# Patient Record
Sex: Male | Born: 1935 | Race: White | Hispanic: No | Marital: Married | Smoking: Former smoker
Health system: Southern US, Community
[De-identification: ages and names within clinical notes are randomized; demographics above are authoritative.]

## PROBLEM LIST (undated history)

## (undated) DIAGNOSIS — I2510 Atherosclerotic heart disease of native coronary artery without angina pectoris: Secondary | ICD-10-CM

## (undated) DIAGNOSIS — D649 Anemia, unspecified: Secondary | ICD-10-CM

## (undated) DIAGNOSIS — M1990 Unspecified osteoarthritis, unspecified site: Secondary | ICD-10-CM

## (undated) DIAGNOSIS — E039 Hypothyroidism, unspecified: Secondary | ICD-10-CM

## (undated) DIAGNOSIS — G2581 Restless legs syndrome: Secondary | ICD-10-CM

## (undated) DIAGNOSIS — I10 Essential (primary) hypertension: Secondary | ICD-10-CM

## (undated) DIAGNOSIS — E7800 Pure hypercholesterolemia, unspecified: Secondary | ICD-10-CM

---

## 2013-10-24 ENCOUNTER — Inpatient Hospital Stay (HOSPITAL_COMMUNITY)
Admission: AD | Admit: 2013-10-24 | Discharge: 2013-10-30 | DRG: 871 | Disposition: A | Discharge status: Skilled Nursing Facility | Payer: Medicare Other | Source: Other Acute Inpatient Hospital | Attending: Pulmonary Disease | Admitting: Pulmonary Disease

## 2013-10-24 ENCOUNTER — Inpatient Hospital Stay (HOSPITAL_COMMUNITY): Payer: Medicare Other

## 2013-10-24 DIAGNOSIS — I10 Essential (primary) hypertension: Secondary | ICD-10-CM | POA: Diagnosis present

## 2013-10-24 DIAGNOSIS — M129 Arthropathy, unspecified: Secondary | ICD-10-CM | POA: Diagnosis present

## 2013-10-24 DIAGNOSIS — E785 Hyperlipidemia, unspecified: Secondary | ICD-10-CM | POA: Diagnosis present

## 2013-10-24 DIAGNOSIS — E46 Unspecified protein-calorie malnutrition: Secondary | ICD-10-CM | POA: Diagnosis present

## 2013-10-24 DIAGNOSIS — Z87891 Personal history of nicotine dependence: Secondary | ICD-10-CM | POA: Diagnosis not present

## 2013-10-24 DIAGNOSIS — IMO0002 Reserved for concepts with insufficient information to code with codable children: Secondary | ICD-10-CM

## 2013-10-24 DIAGNOSIS — I2510 Atherosclerotic heart disease of native coronary artery without angina pectoris: Secondary | ICD-10-CM | POA: Diagnosis present

## 2013-10-24 DIAGNOSIS — K529 Noninfective gastroenteritis and colitis, unspecified: Secondary | ICD-10-CM

## 2013-10-24 DIAGNOSIS — G2581 Restless legs syndrome: Secondary | ICD-10-CM | POA: Diagnosis present

## 2013-10-24 DIAGNOSIS — J9819 Other pulmonary collapse: Secondary | ICD-10-CM | POA: Diagnosis not present

## 2013-10-24 DIAGNOSIS — N179 Acute kidney failure, unspecified: Secondary | ICD-10-CM | POA: Diagnosis not present

## 2013-10-24 DIAGNOSIS — J9600 Acute respiratory failure, unspecified whether with hypoxia or hypercapnia: Secondary | ICD-10-CM | POA: Diagnosis not present

## 2013-10-24 DIAGNOSIS — A419 Sepsis, unspecified organism: Secondary | ICD-10-CM | POA: Diagnosis present

## 2013-10-24 DIAGNOSIS — R6521 Severe sepsis with septic shock: Secondary | ICD-10-CM

## 2013-10-24 DIAGNOSIS — E876 Hypokalemia: Secondary | ICD-10-CM | POA: Diagnosis not present

## 2013-10-24 DIAGNOSIS — E039 Hypothyroidism, unspecified: Secondary | ICD-10-CM | POA: Diagnosis present

## 2013-10-24 DIAGNOSIS — E2749 Other adrenocortical insufficiency: Secondary | ICD-10-CM | POA: Diagnosis present

## 2013-10-24 DIAGNOSIS — Z79899 Other long term (current) drug therapy: Secondary | ICD-10-CM

## 2013-10-24 DIAGNOSIS — K5730 Diverticulosis of large intestine without perforation or abscess without bleeding: Secondary | ICD-10-CM | POA: Diagnosis present

## 2013-10-24 DIAGNOSIS — R578 Other shock: Secondary | ICD-10-CM | POA: Diagnosis present

## 2013-10-24 DIAGNOSIS — I509 Heart failure, unspecified: Secondary | ICD-10-CM | POA: Diagnosis present

## 2013-10-24 DIAGNOSIS — K5289 Other specified noninfective gastroenteritis and colitis: Secondary | ICD-10-CM | POA: Diagnosis present

## 2013-10-24 DIAGNOSIS — D649 Anemia, unspecified: Secondary | ICD-10-CM | POA: Diagnosis present

## 2013-10-24 DIAGNOSIS — R112 Nausea with vomiting, unspecified: Secondary | ICD-10-CM | POA: Diagnosis present

## 2013-10-24 DIAGNOSIS — R5381 Other malaise: Secondary | ICD-10-CM | POA: Diagnosis not present

## 2013-10-24 DIAGNOSIS — R6520 Severe sepsis without septic shock: Secondary | ICD-10-CM

## 2013-10-24 DIAGNOSIS — I5032 Chronic diastolic (congestive) heart failure: Secondary | ICD-10-CM | POA: Diagnosis present

## 2013-10-24 HISTORY — DX: Restless legs syndrome: G25.81

## 2013-10-24 HISTORY — DX: Hypothyroidism, unspecified: E03.9

## 2013-10-24 HISTORY — DX: Anemia, unspecified: D64.9

## 2013-10-24 HISTORY — DX: Pure hypercholesterolemia, unspecified: E78.00

## 2013-10-24 HISTORY — DX: Unspecified osteoarthritis, unspecified site: M19.90

## 2013-10-24 HISTORY — DX: Atherosclerotic heart disease of native coronary artery without angina pectoris: I25.10

## 2013-10-24 HISTORY — DX: Essential (primary) hypertension: I10

## 2013-10-24 LAB — COMPREHENSIVE METABOLIC PANEL
ALK PHOS: 49 U/L (ref 39–117)
ALT: 13 U/L (ref 0–53)
AST: 33 U/L (ref 0–37)
Albumin: 2.7 g/dL — ABNORMAL LOW (ref 3.5–5.2)
BILIRUBIN TOTAL: 0.5 mg/dL (ref 0.3–1.2)
BUN: 24 mg/dL — AB (ref 6–23)
CO2: 21 meq/L (ref 19–32)
CREATININE: 1.01 mg/dL (ref 0.50–1.35)
Calcium: 7.3 mg/dL — ABNORMAL LOW (ref 8.4–10.5)
Chloride: 104 mEq/L (ref 96–112)
GFR, EST AFRICAN AMERICAN: 78 mL/min — AB (ref >90)
GFR, EST NON AFRICAN AMERICAN: 67 mL/min — AB (ref >90)
Glucose, Bld: 125 mg/dL — ABNORMAL HIGH (ref 70–99)
POTASSIUM: 4 meq/L (ref 3.7–5.3)
Sodium: 137 mEq/L (ref 137–147)
Total Protein: 5.4 g/dL — ABNORMAL LOW (ref 6.0–8.3)

## 2013-10-24 LAB — CBC WITH DIFFERENTIAL/PLATELET
Basophils Absolute: 0 10*3/uL (ref 0.0–0.1)
Basophils Relative: 0 % (ref 0–1)
Eosinophils Absolute: 0 10*3/uL (ref 0.0–0.7)
Eosinophils Relative: 0 % (ref 0–5)
HEMATOCRIT: 30.5 % — AB (ref 39.0–52.0)
HEMOGLOBIN: 10.3 g/dL — AB (ref 13.0–17.0)
LYMPHS ABS: 0.6 10*3/uL — AB (ref 0.7–4.0)
Lymphocytes Relative: 10 % — ABNORMAL LOW (ref 12–46)
MCH: 32 pg (ref 26.0–34.0)
MCHC: 33.8 g/dL (ref 30.0–36.0)
MCV: 94.7 fL (ref 78.0–100.0)
MONO ABS: 0.3 10*3/uL (ref 0.1–1.0)
Monocytes Relative: 6 % (ref 3–12)
NEUTROS PCT: 84 % — AB (ref 43–77)
Neutro Abs: 4.6 10*3/uL (ref 1.7–7.7)
Platelets: 146 10*3/uL — ABNORMAL LOW (ref 150–400)
RBC: 3.22 MIL/uL — AB (ref 4.22–5.81)
RDW: 13.9 % (ref 11.5–15.5)
WBC: 5.5 10*3/uL (ref 4.0–10.5)

## 2013-10-24 LAB — LACTIC ACID, PLASMA: Lactic Acid, Venous: 1.6 mmol/L (ref 0.5–2.2)

## 2013-10-24 LAB — PROTIME-INR
INR: 1.15 (ref 0.00–1.49)
Prothrombin Time: 14.5 seconds (ref 11.6–15.2)

## 2013-10-24 LAB — PROCALCITONIN: PROCALCITONIN: 0.98 ng/mL

## 2013-10-24 LAB — PHOSPHORUS: PHOSPHORUS: 2.1 mg/dL — AB (ref 2.3–4.6)

## 2013-10-24 LAB — LIPASE, BLOOD: Lipase: 17 U/L (ref 11–59)

## 2013-10-24 LAB — MAGNESIUM: Magnesium: 1.4 mg/dL — ABNORMAL LOW (ref 1.5–2.5)

## 2013-10-24 LAB — MRSA PCR SCREENING: MRSA BY PCR: NEGATIVE

## 2013-10-24 MED ORDER — SODIUM CHLORIDE 0.9 % IV SOLN: 750.0000 mL | INTRAVENOUS | Status: DC | PRN

## 2013-10-24 MED ORDER — SODIUM CHLORIDE 0.9 % IV SOLN
INTRAVENOUS | Status: DC
  Administered 2013-10-24 – 2013-10-25 (×2): via INTRAVENOUS

## 2013-10-24 MED ORDER — SODIUM CHLORIDE 0.9 % IV BOLUS (SEPSIS)
1000.0000 mL | Freq: Once | INTRAVENOUS | Status: AC
  Administered 2013-10-24: 1000 mL via INTRAVENOUS

## 2013-10-24 MED ORDER — SODIUM PHOSPHATE 3 MMOLE/ML IV SOLN
30.0000 mmol | Freq: Once | INTRAVENOUS | Status: AC
  Administered 2013-10-25: 30 mmol via INTRAVENOUS
  Filled 2013-10-24: qty 10

## 2013-10-24 MED ORDER — ONDANSETRON HCL 4 MG/2ML IJ SOLN: 4.0000 mg | Freq: Four times a day (QID) | INTRAMUSCULAR | Status: DC | PRN

## 2013-10-24 MED ORDER — MAGNESIUM SULFATE 4000MG/100ML IJ SOLN
4.0000 g | Freq: Once | INTRAMUSCULAR | Status: AC
  Administered 2013-10-25: 4 g via INTRAVENOUS
  Filled 2013-10-24: qty 100

## 2013-10-24 MED ORDER — METRONIDAZOLE IN NACL 5-0.79 MG/ML-% IV SOLN
500.0000 mg | Freq: Three times a day (TID) | INTRAVENOUS | Status: DC
  Administered 2013-10-24 – 2013-10-26 (×5): 500 mg via INTRAVENOUS
  Filled 2013-10-24 (×7): qty 100

## 2013-10-24 MED ORDER — SODIUM CHLORIDE 0.9 % IV SOLN
2.0000 ug/min | INTRAVENOUS | Status: DC
  Filled 2013-10-24: qty 4

## 2013-10-24 MED ORDER — LEVOTHYROXINE SODIUM 25 MCG PO TABS
25.0000 ug | ORAL_TABLET | Freq: Every day | ORAL | Status: DC
  Administered 2013-10-25 – 2013-10-30 (×6): 25 ug via ORAL
  Filled 2013-10-24 (×7): qty 1

## 2013-10-24 MED ORDER — HYDROCORTISONE NA SUCCINATE PF 100 MG IJ SOLR
50.0000 mg | Freq: Four times a day (QID) | INTRAMUSCULAR | Status: DC
  Administered 2013-10-24 – 2013-10-25 (×5): 50 mg via INTRAVENOUS
  Filled 2013-10-24 (×6): qty 1

## 2013-10-24 MED ORDER — ROPINIROLE HCL 0.5 MG PO TABS
0.5000 mg | ORAL_TABLET | Freq: Every day | ORAL | Status: DC
  Administered 2013-10-24 – 2013-10-29 (×6): 0.5 mg via ORAL
  Filled 2013-10-24 (×7): qty 1

## 2013-10-24 MED ORDER — SODIUM CHLORIDE 0.9 % IV SOLN: 250.0000 mL | INTRAVENOUS | Status: DC | PRN

## 2013-10-24 MED ORDER — HEPARIN SODIUM (PORCINE) 5000 UNIT/ML IJ SOLN
5000.0000 [IU] | Freq: Three times a day (TID) | INTRAMUSCULAR | Status: DC
  Administered 2013-10-24 – 2013-10-29 (×14): 5000 [IU] via SUBCUTANEOUS
  Filled 2013-10-24 (×17): qty 1

## 2013-10-24 MED ORDER — CIPROFLOXACIN IN D5W 400 MG/200ML IV SOLN
400.0000 mg | Freq: Once | INTRAVENOUS | Status: AC
  Administered 2013-10-24: 400 mg via INTRAVENOUS
  Filled 2013-10-24: qty 200

## 2013-10-24 NOTE — Progress Notes (Signed)
eLink Physician-Brief Progress Note Patient Name: Alexander Dorsey DOB: 18-Oct-1928 MRN: [id number]  Date of Service  10/24/2013   HPI/Events of Note  Hypophosphatemia and hypomag   eICU Interventions  Phos and Mag replaced   Intervention Category Intermediate Interventions: Electrolyte abnormality - evaluation and management  DETERDING,ELIZABETH 10/24/2013, 11:54 PM

## 2013-10-24 NOTE — H&P (Signed)
Name: Alexander Dorsey MRN: [id number] DOB: 13-Aug-1929    ADMISSION DATE:  10/24/2013 CONSULTATION DATE:  2/11  PRIMARY SERVICE: PCCM   CHIEF COMPLAINT:  Nausea, vomiting, and hypotension   BRIEF PATIENT DESCRIPTION:  [age] yom admitted from outside hospital on 2/11 w/ dx of gastroenteritis, and persistent hypotension in spite of 4 liters fluid resuscitation efforts.   SIGNIFICANT EVENTS / STUDIES:  CT abd 2/11: + gastroenteritis. Diverticular disease w/out diverticulitis   LINES / TUBES:   CULTURES: BCX2 2/11>>> UC 2/11>>> Stool culture 2/11>>> Stool O&P 2/11>>> cdiff 2/11>>> resp viral panel 2/11>>>  ANTIBIOTICS: Cipro 2/11>>> Flagyl 2/11>>>  HISTORY OF PRESENT ILLNESS:   This is a 80 year old male admitted in transfer from outside hospital on 2/11 w/ CC: 1 day h/o nausea, and vomiting w/ poor PO intake. Apparently wife had similar illness after contact with sick hair dresser the day prior. Initially seen in ED at [place]. Treated w/ 4 liters crystalloid w/ no improvement. CT abd c/w gastroenteritis. Started on levophed gtt and [facility]   PAST MEDICAL HISTORY :  Past Medical History  Diagnosis Date  . Hypertension   . Hypothyroidism   . Hypercholesteremia   . Coronary artery disease   . Anemia   . Arthritis   . Restless leg syndrome    History reviewed. No pertinent past surgical history. Prior to Admission medications   Medication Sig Start Date End Date Taking? Authorizing Provider  cholecalciferol (VITAMIN D) 1000 UNITS tablet Take 1,000 Units by mouth daily.   Yes Historical Provider, MD  co-enzyme Q-10 50 MG capsule Take 50 mg by mouth daily.   Yes Historical Provider, MD  Cyanocobalamin (VITAMIN B 12 PO) Take 1 tablet by mouth daily.   Yes Historical Provider, MD  enalapril (VASOTEC) 10 MG tablet Take 10 mg by mouth daily.   Yes Historical Provider, MD  levothyroxine (SYNTHROID, LEVOTHROID) 25 MCG tablet Take 25 mcg by mouth daily before  breakfast.   Yes Historical Provider, MD  lovastatin (MEVACOR) 10 MG tablet Take 10 mg by mouth at bedtime.   Yes Historical Provider, MD  Misc Natural Products (NARCOSOFT HERBAL LAX PO) Take 1 tablet by mouth daily.   Yes Historical Provider, MD  rOPINIRole (REQUIP) 0.5 MG tablet Take 0.5 mg by mouth at bedtime.   Yes Historical Provider, MD  tiZANidine (ZANAFLEX) 4 MG tablet Take 2-4 mg by mouth at bedtime.   Yes Historical Provider, MD  traMADol (ULTRAM-ER) 200 MG 24 hr tablet Take 200 mg by mouth daily.   Yes Historical Provider, MD   Allergies  Allergen Reactions  . Tetanus Toxoids Swelling  . Vicodin [Hydrocodone-Acetaminophen] Swelling    FAMILY HISTORY:  History reviewed. No pertinent family history. SOCIAL HISTORY:  reports that he has quit smoking. He does not have any smokeless tobacco history on file. He reports that he does not drink alcohol or use illicit drugs.  Review of Systems:   Bolds are positive  Constitutional: weight loss, gain, night sweats, Fevers, chills, fatigue .  HEENT: headaches, Sore throat, sneezing, nasal congestion, post nasal drip, Difficulty swallowing, Tooth/dental problems, visual complaints visual changes, ear ache CV:  chest pain, radiates: ,Orthopnea, PND, swelling in lower extremities, dizziness, palpitations, syncope.  GI  heartburn, indigestion, abdominal pain, nausea, vomiting, diarrhea, change in bowel habits, loss of appetite, bloody stools.  Resp: cough, productive: , hemoptysis, dyspnea, chest pain, pleuritic.  Skin: rash or itching or icterus GU: dysuria, change in color of urine,  urgency or frequency. flank pain, hematuria  MS: joint pain or swelling. decreased range of motion  Psych: change in mood or affect. depression or anxiety.  Neuro: difficulty with speech, weakness, numbness, ataxia    SUBJECTIVE:  Feels ok  VITAL SIGNS: Temp:  [100.3 F (37.9 C)-101.2 F (38.4 C)] 100.7 F (38.2 C) (02/12 0115) Pulse Rate:  [62-78] 66  (02/12 0115) Resp:  [12-26] 20 (02/12 0115) BP: (75-136)/(41-78) 131/62 mmHg (02/12 0115) SpO2:  [98 %-100 %] 99 % (02/12 0115) Weight:  [125 lb 7.1 oz (56.9 kg)] 125 lb 7.1 oz (56.9 kg) (02/11 2000) HEMODYNAMICS: CVP:  [6 mmHg-7 mmHg] 6 mmHg VENTILATOR SETTINGS:   INTAKE / OUTPUT: Intake/Output     02/11 0701 - 02/12 0700   I.V. (mL/kg) 500.9 (8.8)   IV Piggyback 2443   Total Intake(mL/kg) 2943.9 (51.7)   Urine (mL/kg/hr) 570   Total Output 570   Net +2373.9         PHYSICAL EXAMINATION: General:  No acute distress  Neuro:  Awake, oriented, no focal def  HEENT:  Mm dry, no JVD  Cardiovascular:  rrr Lungs:  Clear  Abdomen:  Soft, non-tender, no OM  Musculoskeletal:  Intact  Skin:  Dry intact   LABS:  CBC  Recent Labs Lab 10/24/13 2108  WBC 5.5  HGB 10.3*  HCT 30.5*  PLT 146*   Coag's  Recent Labs Lab 10/24/13 2108  INR 1.15   BMET  Recent Labs Lab 10/24/13 2108  NA 137  K 4.0  CL 104  CO2 21  BUN 24*  CREATININE 1.01  GLUCOSE 125*   Electrolytes  Recent Labs Lab 10/24/13 2108  CALCIUM 7.3*  MG 1.4*  PHOS 2.1*   Sepsis Markers  Recent Labs Lab 10/24/13 2103 10/24/13 2108  LATICACIDVEN 1.6  --   PROCALCITON  --  0.98   ABG No results found for this basename: PHART, PCO2ART, PO2ART,  in the last 168 hours Liver Enzymes  Recent Labs Lab 10/24/13 2108  AST 33  ALT 13  ALKPHOS 49  BILITOT 0.5  ALBUMIN 2.7*   Cardiac Enzymes No results found for this basename: TROPONINI, PROBNP,  in the last 168 hours Glucose No results found for this basename: GLUCAP,  in the last 168 hours  Imaging Dg Chest Port 1 View  10/25/2013   CLINICAL DATA:  Central line inserted  EXAM: PORTABLE CHEST - 1 VIEW  COMPARISON:  None.  FINDINGS: The mediastinal contour is normal. The heart size is normal. A right jugular central venous line is identified with distal tip in superior vena cava. There is no pneumothorax. There is mild interstitial edema.  There is no pleural effusion. There is scoliosis of spine.  IMPRESSION: Right jugular central venous line distal tip in the superior vena cava. There is no pneumothorax. Mild interstitial edema.   Electronically Signed   By: Sherian Rein M.D.   On: 10/25/2013 01:19   ASSESSMENT / PLAN:  PULMONARY A: No acute. Has some LLL atx on cxr  P:   Wean FIO2 F/u cxr   CARDIOVASCULAR A:  Hypovolemic shock. +/- septic shock vs adrenal insuff. In setting of gastroenteritis       CEs negative  P:  Repeat fluid bolus Ck cortisol If not off pressors after bolus place CVL and start stress steroids Hold antihypertensives   RENAL A:  No acute  P:   Trend chemistry  Hold ace-I   GASTROINTESTINAL A:  Nausea/ vomiting.  Gastroenteritis      Diverticular disease but no diverticulitis  P:   See ID section  Cl liquids  Antiemetics as needed   HEMATOLOGIC A:  Mild anemia w/ no evidence of bleeding      PT is Jehovah's witness  P:  [place] heparin Labs via pediatric draw No blood products, but albumin OK  INFECTIOUS A: gastro-enteritis ? Viral  P:   Pan culture (see above) Empiric cipro/flagyl   ENDOCRINE A:  Hypothyroidism  P:   Cont synthroid   NEUROLOGIC A:  H/o restless leg  P:   Supportive care   TODAY'S SUMMARY: 89 yom admitted from outside hospital on 2/11 w/ dx of gastroenteritis, and persistent hypotension in spite of 4 liters fluid resuscitation efforts. Will give another liter NS. IF still hypotensive will place CVL.   I have personally obtained a history, examined the patient, evaluated laboratory and imaging results, formulated the assessment and plan and placed orders. CRITICAL CARE: The patient is critically ill with multiple organ systems failure and requires high complexity decision making for assessment and support, frequent evaluation and titration of therapies, application of advanced monitoring technologies and extensive interpretation of multiple databases.  Critical Care Time devoted to patient care services described in this note is 60 minutes.    Pulmonary and Critical Care Medicine [facility] Pager: [phone]  10/25/2013, 1:33 AM  Reviewed above, examined pt, and agree with assessment/plan.  80 yo male with acute gastroenteritis and septic/hypovolemic shock >> did not respond to aggressive volume resuscitation and requiring pressors.  Added stress dose steroids.  Continue cipro/flagyl.  F/u Cx results.  Updated family at bedside.  CC time 60 minutes.  Coralyn Helling, MD [facility] Pulmonary/Critical Care 10/25/2013, 1:37 AM Pager:  [phone] After 3pm call: [phone]

## 2013-10-24 NOTE — Progress Notes (Signed)
ANTIBIOTIC CONSULT NOTE - INITIAL  Pharmacy Consult for ciprofloxacin, metronidazole Indication: gastroenteritis  Allergies  Allergen Reactions  . Tetanus Toxoids Swelling  . Vicodin [Hydrocodone-Acetaminophen] Swelling    Patient Measurements: Height: 5\' 7"  (170.2 cm) Weight: 125 lb 7.1 oz (56.9 kg) IBW/kg (Calculated) : 66.1  Vital Signs: Temp: 100.5 F (38.1 C) (02/11 2030) Temp src: Core (Comment) (02/11 2000) BP: 95/58 mmHg (02/11 2100) Pulse Rate: 67 (02/11 2100)  Medical History: No past medical history on file.  Medications:  Prescriptions prior to admission  Medication Sig Dispense Refill  . cholecalciferol (VITAMIN D) 1000 UNITS tablet Take 1,000 Units by mouth daily.      Marland Kitchen. co-enzyme Q-10 50 MG capsule Take 50 mg by mouth daily.      . Cyanocobalamin (VITAMIN B 12 PO) Take 1 tablet by mouth daily.      . enalapril (VASOTEC) 10 MG tablet Take 10 mg by mouth daily.      Marland Kitchen. levothyroxine (SYNTHROID, LEVOTHROID) 25 MCG tablet Take 25 mcg by mouth daily before breakfast.      . lovastatin (MEVACOR) 10 MG tablet Take 10 mg by mouth at bedtime.      . Misc Natural Products (NARCOSOFT HERBAL LAX PO) Take 1 tablet by mouth daily.      Marland Kitchen. rOPINIRole (REQUIP) 0.5 MG tablet Take 0.5 mg by mouth at bedtime.      Marland Kitchen. tiZANidine (ZANAFLEX) 4 MG tablet Take 2-4 mg by mouth at bedtime.      . traMADol (ULTRAM-ER) 200 MG 24 hr tablet Take 200 mg by mouth daily.       Assessment: 80 year old male with likely gastroenteritis. Pharmacy consulted to dose cipro/flagyl.  Pt from outside hospital, labs from 2/11 show no leukocytosis and normal renal function, Scr 1.1.  Urine cx, RVP, stool cx, blood cx, cdiff, all currently pending.  Metronidazole 2/11 >> Ciprofloxacin 2/11 >>  Goal of Therapy:  Eradication of infection  Plan: Metronidazole 500 mg IV q8h Ciprofloxacin 400 mg IV q12h F/u culture results, clinical course  Agapito GamesAlison Ines Warf, PharmD, BCPS Clinical Pharmacist 10/24/2013  9:34 PM

## 2013-10-25 ENCOUNTER — Inpatient Hospital Stay (HOSPITAL_COMMUNITY): Payer: Medicare Other

## 2013-10-25 ENCOUNTER — Encounter (HOSPITAL_COMMUNITY): Payer: Self-pay | Admitting: *Deleted

## 2013-10-25 DIAGNOSIS — K529 Noninfective gastroenteritis and colitis, unspecified: Secondary | ICD-10-CM | POA: Diagnosis present

## 2013-10-25 DIAGNOSIS — A419 Sepsis, unspecified organism: Secondary | ICD-10-CM

## 2013-10-25 DIAGNOSIS — R6520 Severe sepsis without septic shock: Secondary | ICD-10-CM

## 2013-10-25 DIAGNOSIS — R6521 Severe sepsis with septic shock: Secondary | ICD-10-CM

## 2013-10-25 DIAGNOSIS — K5289 Other specified noninfective gastroenteritis and colitis: Secondary | ICD-10-CM

## 2013-10-25 LAB — BASIC METABOLIC PANEL
BUN: 19 mg/dL (ref 6–23)
CALCIUM: 7.2 mg/dL — AB (ref 8.4–10.5)
CO2: 21 mEq/L (ref 19–32)
Chloride: 106 mEq/L (ref 96–112)
Creatinine, Ser: 0.93 mg/dL (ref 0.50–1.35)
GFR, EST AFRICAN AMERICAN: 88 mL/min — AB (ref >90)
GFR, EST NON AFRICAN AMERICAN: 76 mL/min — AB (ref >90)
GLUCOSE: 139 mg/dL — AB (ref 70–99)
Potassium: 3.5 mEq/L — ABNORMAL LOW (ref 3.7–5.3)
Sodium: 138 mEq/L (ref 137–147)

## 2013-10-25 LAB — RESPIRATORY VIRUS PANEL
Adenovirus: NOT DETECTED
Influenza A H1: NOT DETECTED
Influenza A H3: NOT DETECTED
Influenza A: NOT DETECTED
Influenza B: NOT DETECTED
METAPNEUMOVIRUS: NOT DETECTED
PARAINFLUENZA 2 A: NOT DETECTED
Parainfluenza 1: NOT DETECTED
Parainfluenza 3: NOT DETECTED
RHINOVIRUS: NOT DETECTED
Respiratory Syncytial Virus A: NOT DETECTED
Respiratory Syncytial Virus B: NOT DETECTED

## 2013-10-25 LAB — URINE CULTURE
CULTURE: NO GROWTH
Colony Count: NO GROWTH

## 2013-10-25 LAB — CBC
HCT: 28.9 % — ABNORMAL LOW (ref 39.0–52.0)
Hemoglobin: 9.9 g/dL — ABNORMAL LOW (ref 13.0–17.0)
MCH: 32.2 pg (ref 26.0–34.0)
MCHC: 34.3 g/dL (ref 30.0–36.0)
MCV: 94.1 fL (ref 78.0–100.0)
PLATELETS: 135 10*3/uL — AB (ref 150–400)
RBC: 3.07 MIL/uL — ABNORMAL LOW (ref 4.22–5.81)
RDW: 14 % (ref 11.5–15.5)
WBC: 5.4 10*3/uL (ref 4.0–10.5)

## 2013-10-25 LAB — GLUCOSE, CAPILLARY: Glucose-Capillary: 102 mg/dL — ABNORMAL HIGH (ref 70–99)

## 2013-10-25 LAB — CORTISOL: Cortisol, Plasma: 13.6 ug/dL

## 2013-10-25 MED ORDER — POTASSIUM CHLORIDE 10 MEQ/50ML IV SOLN
10.0000 meq | INTRAVENOUS | Status: AC
  Administered 2013-10-25 (×2): 10 meq via INTRAVENOUS
  Filled 2013-10-25 (×2): qty 50

## 2013-10-25 MED ORDER — CIPROFLOXACIN IN D5W 400 MG/200ML IV SOLN
400.0000 mg | Freq: Two times a day (BID) | INTRAVENOUS | Status: DC
  Filled 2013-10-25 (×2): qty 200

## 2013-10-25 MED ORDER — CIPROFLOXACIN IN D5W 400 MG/200ML IV SOLN
400.0000 mg | Freq: Two times a day (BID) | INTRAVENOUS | Status: DC
  Administered 2013-10-25 – 2013-10-27 (×5): 400 mg via INTRAVENOUS
  Filled 2013-10-25 (×7): qty 200

## 2013-10-25 MED ORDER — ENSURE COMPLETE PO LIQD
237.0000 mL | Freq: Two times a day (BID) | ORAL | Status: DC
  Administered 2013-10-25 – 2013-10-30 (×9): 237 mL via ORAL

## 2013-10-25 NOTE — Progress Notes (Signed)
Name: Alexander Dorsey MRN: [id number] DOB: 1929-02-10    ADMISSION DATE:  10/24/2013 CONSULTATION DATE:  2/11  PRIMARY SERVICE: PCCM   CHIEF COMPLAINT:  Nausea, vomiting, and hypotension   BRIEF PATIENT DESCRIPTION:  [age] yom admitted from outside hospital on 2/11 w/ dx of gastroenteritis, and persistent hypotension in spite of 4 liters fluid resuscitation efforts.   SIGNIFICANT EVENTS / STUDIES:  CT abd 2/11: + gastroenteritis. Diverticular disease w/out diverticulitis   LINES / TUBES: R IJ TLC 2/11>>>  CULTURES: BCX2 2/11>>> UC 2/11>>> Stool culture 2/11>>> Stool O&P 2/11>>> cdiff 2/11>>> resp viral panel 2/11>>>  ANTIBIOTICS: Cipro 2/11>>> Flagyl 2/11>>>  SUBJECTIVE: No events overnight.  VITAL SIGNS: Temp:  [100.1 F (37.8 C)-101.2 F (38.4 C)] 100.1 F (37.8 C) (02/12 0645) Pulse Rate:  [59-78] 61 (02/12 0645) Resp:  [12-26] 21 (02/12 0645) BP: (75-136)/(41-84) 111/58 mmHg (02/12 0645) SpO2:  [97 %-100 %] 100 % (02/12 0645) Weight:  [56.9 kg (125 lb 7.1 oz)-59.2 kg (130 lb 8.2 oz)] 59.2 kg (130 lb 8.2 oz) (02/12 0500) HEMODYNAMICS: CVP:  [5 mmHg-7 mmHg] 5 mmHg VENTILATOR SETTINGS:   INTAKE / OUTPUT: Intake/Output     02/11 0701 - 02/12 0700 02/12 0701 - 02/13 0700   I.V. (mL/kg) 1221.1 (20.6)    IV Piggyback 2808    Total Intake(mL/kg) 4029.1 (68.1)    Urine (mL/kg/hr) 1195    Total Output 1195     Net +2834.1           PHYSICAL EXAMINATION: General:  No acute distress  Neuro:  Awake, oriented, no focal def  HEENT:  Mm dry, no JVD  Cardiovascular:  rrr Lungs:  Clear  Abdomen:  Soft, non-tender, no OM  Musculoskeletal:  Intact  Skin:  Dry intact   LABS:  CBC  Recent Labs Lab 10/24/13 2108 10/25/13 0420  WBC 5.5 5.4  HGB 10.3* 9.9*  HCT 30.5* 28.9*  PLT 146* 135*   Coag's  Recent Labs Lab 10/24/13 2108  INR 1.15   BMET  Recent Labs Lab 10/24/13 2108 10/25/13 0420  NA 137 138  K 4.0 3.5*  CL 104 106  CO2 21 21  BUN  24* 19  CREATININE 1.01 0.93  GLUCOSE 125* 139*   Electrolytes  Recent Labs Lab 10/24/13 2108 10/25/13 0420  CALCIUM 7.3* 7.2*  MG 1.4*  --   PHOS 2.1*  --    Sepsis Markers  Recent Labs Lab 10/24/13 2103 10/24/13 2108  LATICACIDVEN 1.6  --   PROCALCITON  --  0.98   ABG No results found for this basename: PHART, PCO2ART, PO2ART,  in the last 168 hours Liver Enzymes  Recent Labs Lab 10/24/13 2108  AST 33  ALT 13  ALKPHOS 49  BILITOT 0.5  ALBUMIN 2.7*   Cardiac Enzymes No results found for this basename: TROPONINI, PROBNP,  in the last 168 hours Glucose No results found for this basename: GLUCAP,  in the last 168 hours  Imaging Dg Chest Port 1 View  10/25/2013   CLINICAL DATA:  Central line inserted  EXAM: PORTABLE CHEST - 1 VIEW  COMPARISON:  None.  FINDINGS: The mediastinal contour is normal. The heart size is normal. A right jugular central venous line is identified with distal tip in superior vena cava. There is no pneumothorax. There is mild interstitial edema. There is no pleural effusion. There is scoliosis of spine.  IMPRESSION: Right jugular central venous line distal tip in the superior vena cava. There is  no pneumothorax. Mild interstitial edema.   Electronically Signed   By: Sherian Rein M.D.   On: 10/25/2013 01:19   ASSESSMENT / PLAN:  PULMONARY A: No acute. Has some LLL atx on cxr  P:   - Titrate O2 for sat of 88-92%. - Pulmonary hygienes. - CXR consistent with pulmonary edema at this point.  CARDIOVASCULAR A:  Hypovolemic shock. +/- septic shock vs adrenal insuff. In setting of gastroenteritis       CEs negative  P:  - KVO IVF. - Cortisol 13.6 on stress dose steroids. - Titrate pressors as tolerated. - Hold antihypertensives. - 2D echo.  RENAL A:  No acute  P:   - Trend chemistry. - Hold ACE-I. - Replace electrolytes as needed.  GASTROINTESTINAL A:  Nausea/ vomiting.       Gastroenteritis      Diverticular disease but no  diverticulitis  P:   - See ID section. - Antiemetics as needed.  HEMATOLOGIC A:  Mild anemia w/ no evidence of bleeding      PT is Jehovah's witness  P:  - [place] heparin. - Labs via pediatric draw. - No blood products, but albumin OK.  INFECTIOUS A: gastro-enteritis ? Viral  P:   - Pan culture (see above). - Empiric cipro/flagyl.  ENDOCRINE A:  Hypothyroidism  P:   - Cont synthroid.  NEUROLOGIC A:  H/o restless leg  P:   - Supportive care.  TODAY'S SUMMARY: 76 yom admitted from outside hospital on 2/11 w/ dx of gastroenteritis, and persistent hypotension in spite of 7 liters fluid resuscitation efforts. CXR consistent with pulmonary edema at this point, will [facility] IVF and rely more on pressors given CXR findings.  Continue stress dose steroids.   I have personally obtained a history, examined the patient, evaluated laboratory and imaging results, formulated the assessment and plan and placed orders.  CRITICAL CARE: The patient is critically ill with multiple organ systems failure and requires high complexity decision making for assessment and support, frequent evaluation and titration of therapies, application of advanced monitoring technologies and extensive interpretation of multiple databases. Critical Care Time devoted to patient care services described in this note is 35 minutes.   Alyson Reedy, M.D. [facility] Pulmonary/Critical Care Medicine. Pager: [phone]. After hours pager: [phone].

## 2013-10-25 NOTE — Progress Notes (Signed)
[place] ICU Electrolyte Replacement Protocol  Patient Name: Alexander Dorsey DOB: April 29, 1929 MRN: [id number]  Date of Service  10/25/2013   HPI/Events of Note    Recent Labs Lab 10/24/13 2108 10/25/13 0420  NA 137 138  K 4.0 3.5*  CL 104 106  CO2 21 21  GLUCOSE 125* 139*  BUN 24* 19  CREATININE 1.01 0.93  CALCIUM 7.3* 7.2*  MG 1.4*  --   PHOS 2.1*  --     Estimated Creatinine Clearance: 49.3 ml/min (by C-G formula based on Cr of 0.93).  Intake/Output     02/11 0701 - 02/12 0700   I.V. (mL/kg) 1081.1 (19)   IV Piggyback 2615   Total Intake(mL/kg) 3696.1 (65)   Urine (mL/kg/hr) 970   Total Output 970   Net +2726.1        - I/O DETAILED x24h    Total I/O In: 3696.1 [I.V.:1081.1; IV Piggyback:2615] Out: 970 [Urine:970] - I/O THIS SHIFT    ASSESSMENT   eICURN Interventions  K+ 3.5  Electrolyte protocol criteria met. Value replaced per protocol. MD notified.   ASSESSMENT: MAJOR ELECTROLYTE    Alexander Dorsey 10/25/2013, 6:03 AM

## 2013-10-25 NOTE — Progress Notes (Signed)
INITIAL NUTRITION ASSESSMENT  DOCUMENTATION CODES Per approved criteria  -Severe malnutrition in the context of chronic illness   INTERVENTION: Encouraged high kcal, high protein foods. Recommend diet liberalization to Regular. Add Ensure Complete po BID, each supplement provides 350 kcal and 13 grams of protein, between meals.  RD to continue to follow nutrition care plan.  NUTRITION DIAGNOSIS: Inadequate oral intake related to variable appetite as evidenced by pt report.   Goal: Intake to meet >90% of estimated nutrition needs.  Monitor:  weight trends, lab trends, I/O's, PO intake, supplement tolerance  Reason for Assessment: Malnutrition Screening Tool  80 y.o. male  Admitting Dx: gastroenteritis  ASSESSMENT: PMHx significant for HTN, hypothyroidism, hypercholesterolemia. Admitted with gastroenteritis and hypotension x 1 day.   Required repletion of phosphorus and magnesium 2/11. Potassium, magnesium and phosphorus are all low at this time.  CXR reveals pulmonary edema.  Ordered for Clear Liquids on admission, now advanced to Heart Healthy diet. Pt is currently eating breakfast. Endorses improving appetite. Pt notes that he has been having difficulties keeping his weight stable over the past few months. Weighed 140 lb last fall and is now down to 125 lb. States that his PCP has noticed his weight loss, and has recommended increasing his snacks between meals. Pt says he has tried to do that but has still lost weight. He notes that his snacks vary from things such as raw carrots to chicken salad sandwiches. We discussed need for increased kcal/protein of snacks, and choosing sandwiches more often.  Pt is open to Ensure Complete. Discussed using between meals to supplement oral intake of meals.  Nutrition Focused Physical Exam:  Subcutaneous Fat:  Orbital Region: WNL Upper Arm Region: moderate depletion Thoracic and Lumbar Region: n/a  Muscle:  Temple Region: moderate  depletion Clavicle Bone Region: severe depletion Clavicle and Acromion Bone Region: severe depletion Scapular Bone Region: n/a Dorsal Hand: moderate depletion Patellar Region: n/a Anterior Thigh Region: severe depletion Posterior Calf Region: n/a  Edema: n/a  Pt meets criteria for severe MALNUTRITION in the context of chronic illness as evidenced by 16% wt loss x 8 months and severe muscle mass loss.   Height: Ht Readings from Last 1 Encounters:  10/24/13 5\' 7"  (1.702 m)    Weight: Wt Readings from Last 1 Encounters:  10/25/13 130 lb 8.2 oz (59.2 kg)  Actual body weight is 125 lb, per pt  Ideal Body Weight: 148 lb  % Ideal Body Weight: 84%  Wt Readings from Last 10 Encounters:  10/25/13 130 lb 8.2 oz (59.2 kg)    Usual Body Weight: 140 lb  % Usual Body Weight: 89%  BMI:  Body mass index is 20.44 kg/(m^2). Normal weight  Estimated Nutritional Needs: Kcal: 1500 - 1700 Protein: 65 - 75 g Fluid: 1.8 - 2 liters  Skin: intact  Diet Order: Cardiac  EDUCATION NEEDS: -No education needs identified at this time   Intake/Output Summary (Last 24 hours) at 10/25/13 0849 Last data filed at 10/25/13 0700  Gross per 24 hour  Intake 4165.37 ml  Output   1195 ml  Net 2970.37 ml    Last BM: 2/11  Labs:   Recent Labs Lab 10/24/13 2108 10/25/13 0420  NA 137 138  K 4.0 3.5*  CL 104 106  CO2 21 21  BUN 24* 19  CREATININE 1.01 0.93  CALCIUM 7.3* 7.2*  MG 1.4*  --   PHOS 2.1*  --   GLUCOSE 125* 139*    CBG (last 3)  No results found for this basename: GLUCAP,  in the last 72 hours  Scheduled Meds: . ciprofloxacin  400 mg Intravenous Q12H  . heparin  5,000 Units Subcutaneous 3 times per day  . hydrocortisone sod succinate (SOLU-CORTEF) inj  50 mg Intravenous Q6H  . levothyroxine  25 mcg Oral QAC breakfast  . metronidazole  500 mg Intravenous Q8H  . rOPINIRole  0.5 mg Oral QHS    Continuous Infusions: . sodium chloride 125 mL/hr at 10/25/13 0036  .  norepinephrine (LEVOPHED) Adult infusion 3 mcg/min (10/25/13 0600)    Past Medical History  Diagnosis Date  . Hypertension   . Hypothyroidism   . Hypercholesteremia   . Coronary artery disease   . Anemia   . Arthritis   . Restless leg syndrome     History reviewed. No pertinent past surgical history.  Alexander MottoSamantha Lavaris Sexson MS, RD, LDN Inpatient Registered Dietitian Pager: [phone] After-hours pager: [phone]

## 2013-10-25 NOTE — Progress Notes (Signed)
PCCM  Pt ready for trf to regular room.  All vitals stable.  See orders  Luisa HartPatrick WrightMD

## 2013-10-25 NOTE — Procedures (Signed)
Central Venous Catheter Insertion Procedure Note Alexander Dorsey [id number] 02-Nov-1928  Procedure: Insertion of Central Venous Catheter Indications: Assessment of intravascular volume, Drug and/or fluid administration and Frequent blood sampling  Procedure Details Consent: Risks of procedure as well as the alternatives and risks of each were explained to the (patient/caregiver).  Consent for procedure obtained. Time Out: Verified patient identification, verified procedure, site/side was marked, verified correct patient position, special equipment/implants available, medications/allergies/relevent history reviewed, required imaging and test results available.  Performed Real time US was used to ID and cannulate the vessel  Maximum sterile technique was used including antiseptics, cap, gloves, gown, hand hygiene, mask and sheet. Skin prep: Chlorhexidine; local anesthetic administered A antimicrobial bonded/coated triple lumen catheter was placed in the right internal jugular vein using the Seldinger technique.  Evaluation Blood flow good Complications: No apparent complications Patient did tolerate procedure well. Chest X-ray ordered to verify placement.  CXR: normal.  BABCOCK,PETE 10/25/2013, 1:18 AM  I was present for procedure.  Coralyn HellingVineet Climmie Cronce, MD [facility] Pulmonary/Critical Care 10/25/2013, 1:37 AM Pager:  [phone] After 3pm call: [phone]

## 2013-10-25 NOTE — Progress Notes (Signed)
UR Completed.  Husain Costabile Jane [phone] 10/25/2013  

## 2013-10-26 ENCOUNTER — Inpatient Hospital Stay (HOSPITAL_COMMUNITY): Payer: Medicare Other

## 2013-10-26 DIAGNOSIS — I369 Nonrheumatic tricuspid valve disorder, unspecified: Secondary | ICD-10-CM

## 2013-10-26 LAB — BASIC METABOLIC PANEL
BUN: 19 mg/dL (ref 6–23)
CHLORIDE: 106 meq/L (ref 96–112)
CO2: 21 mEq/L (ref 19–32)
Calcium: 7.4 mg/dL — ABNORMAL LOW (ref 8.4–10.5)
Creatinine, Ser: 0.92 mg/dL (ref 0.50–1.35)
GFR calc Af Amer: 89 mL/min — ABNORMAL LOW (ref >90)
GFR, EST NON AFRICAN AMERICAN: 76 mL/min — AB (ref >90)
Glucose, Bld: 126 mg/dL — ABNORMAL HIGH (ref 70–99)
POTASSIUM: 3.5 meq/L — AB (ref 3.7–5.3)
Sodium: 136 mEq/L — ABNORMAL LOW (ref 137–147)

## 2013-10-26 LAB — FECAL LACTOFERRIN, QUANT: Fecal Lactoferrin: POSITIVE

## 2013-10-26 LAB — CBC
HEMATOCRIT: 26.9 % — AB (ref 39.0–52.0)
HEMOGLOBIN: 9.4 g/dL — AB (ref 13.0–17.0)
MCH: 32.6 pg (ref 26.0–34.0)
MCHC: 34.9 g/dL (ref 30.0–36.0)
MCV: 93.4 fL (ref 78.0–100.0)
Platelets: 128 10*3/uL — ABNORMAL LOW (ref 150–400)
RBC: 2.88 MIL/uL — AB (ref 4.22–5.81)
RDW: 13.8 % (ref 11.5–15.5)
WBC: 6.1 10*3/uL (ref 4.0–10.5)

## 2013-10-26 LAB — OVA AND PARASITE EXAMINATION
Ova and parasites: NONE SEEN
SPECIAL REQUESTS: NORMAL

## 2013-10-26 LAB — CLOSTRIDIUM DIFFICILE BY PCR: Toxigenic C. Difficile by PCR: NEGATIVE

## 2013-10-26 LAB — MAGNESIUM: MAGNESIUM: 1.9 mg/dL (ref 1.5–2.5)

## 2013-10-26 LAB — PHOSPHORUS: PHOSPHORUS: 2.2 mg/dL — AB (ref 2.3–4.6)

## 2013-10-26 MED ORDER — ZOLPIDEM TARTRATE 5 MG PO TABS
5.0000 mg | ORAL_TABLET | Freq: Every evening | ORAL | Status: DC | PRN
  Administered 2013-10-26 – 2013-10-29 (×4): 5 mg via ORAL
  Filled 2013-10-26 (×4): qty 1

## 2013-10-26 MED ORDER — SODIUM CHLORIDE 0.9 % IJ SOLN
10.0000 mL | INTRAMUSCULAR | Status: DC | PRN
  Administered 2013-10-26 (×2): 10 mL

## 2013-10-26 MED ORDER — POTASSIUM CHLORIDE CRYS ER 20 MEQ PO TBCR
20.0000 meq | EXTENDED_RELEASE_TABLET | Freq: Two times a day (BID) | ORAL | Status: DC
  Administered 2013-10-26 – 2013-10-30 (×9): 20 meq via ORAL
  Filled 2013-10-26 (×12): qty 1

## 2013-10-26 NOTE — Progress Notes (Signed)
Name: Alexander Dorsey MRN: [id number] DOB: 03-09-1929    ADMISSION DATE:  10/24/2013 CONSULTATION DATE:  2/11  PRIMARY SERVICE: PCCM   CHIEF COMPLAINT:  Nausea, vomiting, and hypotension   BRIEF PATIENT DESCRIPTION:  68 yom admitted from outside hospital on 2/11 w/ dx of gastroenteritis, and persistent hypotension in spite of 4 liters fluid resuscitation efforts.   SIGNIFICANT EVENTS: 2/11 Admit to ICU 2/12 Transfer to medical floor  STUDIES:  CT abd 2/11: + gastroenteritis. Diverticular disease w/out diverticulitis   LINES / TUBES: R IJ TLC 2/11>>>  CULTURES: BCX2 2/11>>> UC 2/11>>>neg Stool culture 2/11>>> Stool O&P 2/11>>>neg cdiff 2/11>>>neg resp viral panel 2/11>>>neg  ANTIBIOTICS: Cipro 2/11>>> Flagyl 2/11>>>2/13  SUBJECTIVE: Moved to floor  VITAL SIGNS: Temp:  [98.1 F (36.7 C)-99.9 F (37.7 C)] 98.1 F (36.7 C) (02/13 0519) Pulse Rate:  [58-73] 62 (02/13 0519) Resp:  [14-28] 20 (02/13 0519) BP: (92-121)/(49-71) 103/57 mmHg (02/13 0519) SpO2:  [97 %-100 %] 97 % (02/13 0519) Weight:  [111 lb 1.8 oz (50.4 kg)] 111 lb 1.8 oz (50.4 kg) (02/13 0519) INTAKE / OUTPUT: Intake/Output     02/12 0701 - 02/13 0700 02/13 0701 - 02/14 0700   P.O. 610    I.V. (mL/kg) 970.1 (19.2)    IV Piggyback 650    Total Intake(mL/kg) 2230.1 (44.2)    Urine (mL/kg/hr) 1305 (1.1)    Total Output 1305     Net +925.1          Stool Occurrence 3 x     PHYSICAL EXAMINATION: General:  No acute distress , wants rt i j cvl out Neuro:  Awake, oriented, no focal def  HEENT:  Mm dry, no JVD  Cardiovascular:  rrr Lungs:  Clear  Abdomen:  Soft, non-tender, no OM  Musculoskeletal:  Intact  Skin:  Dry intact   LABS:  CBC  Recent Labs Lab 10/24/13 2108 10/25/13 0420 10/26/13 0620  WBC 5.5 5.4 6.1  HGB 10.3* 9.9* 9.4*  HCT 30.5* 28.9* 26.9*  PLT 146* 135* 128*   Coag's  Recent Labs Lab 10/24/13 2108  INR 1.15   BMET  Recent Labs Lab 10/24/13 2108  10/25/13 0420 10/26/13 0620  NA 137 138 136*  K 4.0 3.5* 3.5*  CL 104 106 106  CO2 21 21 21   BUN 24* 19 19  CREATININE 1.01 0.93 0.92  GLUCOSE 125* 139* 126*   Electrolytes  Recent Labs Lab 10/24/13 2108 10/25/13 0420 10/26/13 0620  CALCIUM 7.3* 7.2* 7.4*  MG 1.4*  --  1.9  PHOS 2.1*  --  2.2*   Sepsis Markers  Recent Labs Lab 10/24/13 2103 10/24/13 2108  LATICACIDVEN 1.6  --   PROCALCITON  --  0.98   Liver Enzymes  Recent Labs Lab 10/24/13 2108  AST 33  ALT 13  ALKPHOS 49  BILITOT 0.5  ALBUMIN 2.7*   Glucose  Recent Labs Lab 10/24/13 1951  GLUCAP 102*    Imaging Dg Chest Port 1 View  10/26/2013   CLINICAL DATA:  Cough, pulmonary edema  EXAM: PORTABLE CHEST - 1 VIEW  COMPARISON:  DG CHEST 1V PORT dated 10/25/2013  FINDINGS: Low lung volumes. The cardiac silhouette is thinning upper limits normal. A right-sided internal jugular catheter is appreciated with tip projecting region superior vena cava. There is mild prominence of the interstitial markings. Mild area of increased density projects within the lingula region. The osseous structures are unremarkable. When compared to the previous study there is decreased conspicuity  of the interstitial prominence.  IMPRESSION: Decreased conspicuity of the prominent interstitial markings. Differential considerations include resolving pulmonary edema versus a resolving infectious or inflammatory interstitial infiltrate. Atelectasis versus infiltrate left lung base.   Electronically Signed   By: Salome HolmesHector  Cooper M.D.   On: 10/26/2013 08:01   Dg Chest Port 1 View  10/25/2013   CLINICAL DATA:  Productive cough and congestion  EXAM: PORTABLE CHEST - 1 VIEW  COMPARISON:  Chest x-ray from yesterday  FINDINGS: Right IJ catheter in unchanged position. Normal heart size. Mediastinal contours are distorted by leftward rotation.  There are faint Kerley B-lines. Indistinct, but somewhat streaky bibasilar opacities. No significant effusion.  No pneumothorax.  IMPRESSION: 1. Bronchitic changes which could be congestive or infectious. 2. Bibasilar atelectasis or pneumonia.   Electronically Signed   By: Tiburcio PeaJonathan  Watts M.D.   On: 10/25/2013 08:03   Dg Chest Port 1 View  10/25/2013   CLINICAL DATA:  Central line inserted  EXAM: PORTABLE CHEST - 1 VIEW  COMPARISON:  None.  FINDINGS: The mediastinal contour is normal. The heart size is normal. A right jugular central venous line is identified with distal tip in superior vena cava. There is no pneumothorax. There is mild interstitial edema. There is no pleural effusion. There is scoliosis of spine.  IMPRESSION: Right jugular central venous line distal tip in the superior vena cava. There is no pneumothorax. Mild interstitial edema.   Electronically Signed   By: Sherian ReinWei-Chen  Lin M.D.   On: 10/25/2013 01:19   ASSESSMENT / PLAN:  PULMONARY A:  Interstitial edema >> improved. Atelectasis. P:   - Titrate O2 for sat of 88-92%. - Pulmonary hygienes.  CARDIOVASCULAR A:   Hypovolemic shock. +/- septic shock vs adrenal insuff (cortisol 13/6 from 2/13) in setting of gastroenteritis (resolved). Hx of HTN, CAD, hyperlipidemia. P:  - KVO IVF. - off steroids 2/13. - Hold enalapril, mevacor for now - f/u Echo  RENAL A:   AKI >> improving. P:   - Trend chemistry. - Hold ACE-I. - Replace electrolytes as needed.  GASTROINTESTINAL A:   Nausea/ vomiting from gastroenteritis. Hx of diverticular disease but no diverticulitis.  Protein cal malnutrition  P:   - regular diet  HEMATOLOGIC A:   Mild anemia w/ no evidence of bleeding. PT is Jehovah's witness.  P:  - [place] heparin for DVT prevention - Labs via pediatric draw. - No blood products, but albumin OK.  INFECTIOUS A: Gastro-enteritis ? Viral  P:   - Pan culture (see above). - continue cipro for 5 day course  ENDOCRINE A:   Hx of hypothyroidism. P:   - Continue synthroid.  NEUROLOGIC A:   H/o restless leg. P:   -  Requip  TODAY'S SUMMARY: D/c CVL.  PT consult.  If stable, then resume outpt meds and change to oral Abx 2/14.  May be ready for d/c home 2/14 depending on PT needs.   Brett CanalesSteve Minor ACNP [facility] Pager [phone] till 3 pm If no answer page [phone] 10/26/2013, 10:52 AM

## 2013-10-26 NOTE — Evaluation (Signed)
Physical Therapy Evaluation Patient Details Name: Gram Siedlecki MRN: [id number] DOB: 1929/08/08 Today's Date: 10/26/2013 Time: 4098-1191 PT Time Calculation (min): 34 min  PT Assessment / Plan / Recommendation History of Present Illness  80 y.o. male admitted to [facility] on 10/24/13 with dx of gastroenteritis, and persistent hypotension in spite of 4 liters fluid resuscitation efforts.    Clinical Impression  Pt is deconditioned and very unsteady on his feet.  He has a wife at home, but she uses a RW and can only provide supervision.  He will need SNF level therapy before going home with his wife.   PT to follow acutely for deficits listed below.     PT Assessment  Patient needs continued PT services    Follow Up Recommendations  SNF    Does the patient have the potential to tolerate intense rehabilitation     NA  Barriers to Discharge Decreased caregiver support wife can only provide supervision    Equipment Recommendations  Rolling walker with 5" wheels    Recommendations for Other Services   NA  Frequency Min 3X/week    Precautions / Restrictions Precautions Precautions: Fall Precaution Comments: pt with h/o 3 falls in the past few months.     Pertinent Vitals/Pain See vitals flow sheet.       Mobility  Bed Mobility Overal bed mobility: Needs Assistance Bed Mobility: Supine to Sit Supine to sit: Min assist General bed mobility comments: min assist to support trunk to get to sitting.  Pt managing his own legs.  Pt relying heavily on bed rail for support.  Transfers Overall transfer level: Needs assistance Equipment used: Rolling walker (2 wheeled);None Transfers: Sit to/from Stand Sit to Stand: Mod assist General transfer comment: mod assist to support trunk to get to standing without assistive device.  With multiple attempts from multiple surfaces, pt was able to improve to min assist with verbal cues for safe hand palcement and the use of RW or grab bars in the  bathroom.  Ambulation/Gait Ambulation/Gait assistance: Min assist Ambulation Distance (Feet): 90 Feet Assistive device: Rolling walker (2 wheeled) Gait Pattern/deviations: Step-through pattern;Shuffle;Trunk flexed Gait velocity: decreased Gait velocity interpretation: <1.8 ft/sec, indicative of risk for recurrent falls General Gait Details: Verbal cues for safe RW use, upright posture and min assist to support trunk over weak legs.         PT Diagnosis: Abnormality of gait;Difficulty walking;Generalized weakness  PT Problem List: Decreased strength;Decreased activity tolerance;Decreased balance;Decreased mobility;Decreased knowledge of use of DME;Decreased knowledge of precautions PT Treatment Interventions: DME instruction;Gait training;Stair training;Functional mobility training;Therapeutic activities;Therapeutic exercise;Balance training;Neuromuscular re-education;Patient/family education     PT Goals(Current goals can be found in the care plan section) Acute Rehab PT Goals Patient Stated Goal: to get stronger so he can go home PT Goal Formulation: With patient/family Time For Goal Achievement: 11/09/13 Potential to Achieve Goals: Good  Visit Information  Last PT Received On: 10/26/13 Assistance Needed: +1 History of Present Illness: 80 y.o. male admitted to [facility] on 10/24/13 with dx of gastroenteritis, and persistent hypotension in spite of 4 liters fluid resuscitation efforts.         Prior Functioning  Home Living Family/patient expects to be discharged to:: Private residence Living Arrangements: Spouse/significant other Available Help at Discharge: Family;Available 24 hours/day;Personal care attendant (wife uses RW, aide 2 hours per day 5 days per week) Type of Home: House Home Access: Stairs to enter [employer] of Steps: 1 Entrance Stairs-Rails: None Home Layout: One level Home Equipment: [employer] -  quad Prior Function Level of Independence: Independent with  assistive device(s) Comments: Uses quad cane mostly outside.  Falls frequently, drives, but has been advised not to. Communication Communication: HOH    Cognition  Cognition Arousal/Alertness: Awake/alert Behavior During Therapy: WFL for tasks assessed/performed Overall Cognitive Status: Within Functional Limits for tasks assessed    Extremity/Trunk Assessment Upper Extremity Assessment Upper Extremity Assessment: Generalized weakness Lower Extremity Assessment Lower Extremity Assessment: Generalized weakness Cervical / Trunk Assessment Cervical / Trunk Assessment: Other exceptions Cervical / Trunk Exceptions: stooped posture, flexed at hips and trunk with gait at baseline.  Pt reports low back "arthritis"   Balance Balance Overall balance assessment: Needs assistance Sitting-balance support: No upper extremity supported;Feet supported Sitting balance-Leahy Scale: Good Standing balance support: Bilateral upper extremity supported Standing balance-Leahy Scale: Poor  End of Session PT - End of Session Equipment Utilized During Treatment: Gait belt Activity Tolerance: Patient limited by fatigue Patient left: in chair;with call bell/phone within reach;with family/visitor present       Lurena Joinerebecca B. Briona Korpela, PT, DPT [phone]   10/26/2013, 2:56 PM

## 2013-10-26 NOTE — Progress Notes (Signed)
  Echocardiogram 2D Echocardiogram has been performed.  Georgian CoWILLIAMS, Jahdai Padovano 10/26/2013, 11:35 AM

## 2013-10-26 NOTE — Progress Notes (Signed)
ANTIBIOTIC CONSULT NOTE - FOLLOW UP  Pharmacy Consult for Cipro Indication: Gastroenteritis  Allergies  Allergen Reactions  . Tetanus Toxoids Swelling  . Vicodin [Hydrocodone-Acetaminophen] Swelling    Patient Measurements: Height: 5\' 7"  (170.2 cm) Weight: 111 lb 1.8 oz (50.4 kg) IBW/kg (Calculated) : 66.1 Adjusted Body Weight:   Vital Signs: Temp: 98.1 F (36.7 C) (02/13 0519) Temp src: Oral (02/13 0519) BP: 103/57 mmHg (02/13 0519) Pulse Rate: 62 (02/13 0519) Intake/Output from previous day: 02/12 0701 - 02/13 0700 In: 2230.1 [P.O.:610; I.V.:970.1; IV Piggyback:650] Out: 2952 [Urine:1305] Intake/Output from this shift: Total I/O In: 200 [IV Piggyback:200] Out: -   Labs:  Recent Labs  10/24/13 2108 10/25/13 0420 10/26/13 0620  WBC 5.5 5.4 6.1  HGB 10.3* 9.9* 9.4*  PLT 146* 135* 128*  CREATININE 1.01 0.93 0.92   Estimated Creatinine Clearance: 44.1 ml/min (by C-G formula based on Cr of 0.92). No results found for this basename: VANCOTROUGH, Corlis Leak, VANCORANDOM, [place], GENTPEAK, GENTRANDOM, TOBRATROUGH, TOBRAPEAK, TOBRARND, AMIKACINPEAK, AMIKACINTROU, AMIKACIN,  in the last 72 hours   Microbiology: Recent Results (from the past 720 hour(s))  MRSA PCR SCREENING     Status: None   Collection Time    10/24/13  8:05 PM      Result Value Ref Range Status   MRSA by PCR NEGATIVE  NEGATIVE Final   Comment:            The GeneXpert MRSA Assay (FDA     approved for NASAL specimens     only), is one component of a     comprehensive MRSA colonization     surveillance program. It is not     intended to diagnose MRSA     infection nor to guide or     monitor treatment for     MRSA infections.  URINE CULTURE     Status: None   Collection Time    10/24/13  9:16 PM      Result Value Ref Range Status   Specimen Description URINE, CATHETERIZED   Final   Special Requests NONE   Final   Culture  Setup Time     Final   Value: 10/24/2013 21:25     Performed at  [facility]     Final   Value: NO GROWTH     Performed at [facility]   Culture     Final   Value: NO GROWTH     Performed at [facility]   Report Status 10/25/2013 FINAL   Final  CULTURE, BLOOD (ROUTINE X 2)     Status: None   Collection Time    10/24/13  9:40 PM      Result Value Ref Range Status   Specimen Description BLOOD RIGHT ARM   Final   Special Requests BOTTLES DRAWN AEROBIC ONLY 5CC   Final   Culture  Setup Time     Final   Value: 10/25/2013 01:38     Performed at [facility]   Culture     Final   Value:        BLOOD CULTURE RECEIVED NO GROWTH TO DATE CULTURE WILL BE HELD FOR 5 DAYS BEFORE ISSUING A FINAL NEGATIVE REPORT     Performed at [facility]   Report Status PENDING   Incomplete  CULTURE, BLOOD (ROUTINE X 2)     Status: None   Collection Time    10/24/13  9:44 PM  Result Value Ref Range Status   Specimen Description BLOOD RIGHT FOREARM   Final   Special Requests BOTTLES DRAWN AEROBIC ONLY 5CC   Final   Culture  Setup Time     Final   Value: 10/25/2013 01:38     Performed at [facility]   Culture     Final   Value:        BLOOD CULTURE RECEIVED NO GROWTH TO DATE CULTURE WILL BE HELD FOR 5 DAYS BEFORE ISSUING A FINAL NEGATIVE REPORT     Performed at [facility]   Report Status PENDING   Incomplete  RESPIRATORY VIRUS PANEL     Status: None   Collection Time    10/24/13  9:48 PM      Result Value Ref Range Status   Source - RVPAN NASOPHARYNGEAL   Final   Respiratory Syncytial Virus A NOT DETECTED   Final   Respiratory Syncytial Virus B NOT DETECTED   Final   Influenza A NOT DETECTED   Final   Influenza B NOT DETECTED   Final   Parainfluenza 1 NOT DETECTED   Final   Parainfluenza 2 NOT DETECTED   Final   Parainfluenza 3 NOT DETECTED   Final   Metapneumovirus NOT DETECTED   Final   Rhinovirus NOT DETECTED   Final   Adenovirus NOT DETECTED   Final   Influenza A H1 NOT DETECTED    Final   Influenza A H3 NOT DETECTED   Final   Comment: (NOTE)           Normal Reference Range for each Analyte: NOT DETECTED     Testing performed using the Luminex xTAG Respiratory Viral Panel test     kit.     This test was developed and its performance characteristics determined     by [facility]. It has not been cleared or approved by the [place]     Food and Drug Administration. This test is used for clinical purposes.     It should not be regarded as investigational or for research. This     laboratory is certified under the [place] (CLIA) as qualified to perform high complexity     clinical laboratory testing.     Performed at [facility] DIFFICILE BY PCR     Status: None   Collection Time    10/25/13 10:21 PM      Result Value Ref Range Status   C difficile by pcr NEGATIVE  NEGATIVE Final  OVA AND PARASITE EXAMINATION     Status: None   Collection Time    10/25/13 10:21 PM      Result Value Ref Range Status   Specimen Description STOOL   Final   Special Requests Normal   Final   Ova and parasites     Final   Value: NO OVA OR PARASITES SEEN     Performed at [facility]   Report Status 10/26/2013 FINAL   Final    Anti-infectives   Start     Dose/Rate Route Frequency Ordered Stop   10/25/13 1000  ciprofloxacin (CIPRO) IVPB 400 mg     400 mg 200 mL/hr over 60 Minutes Intravenous Every 12 hours 10/25/13 0156     10/25/13 0145  ciprofloxacin (CIPRO) IVPB 400 mg  Status:  Discontinued     400 mg 200 mL/hr over 60 Minutes Intravenous Every 12  hours 10/25/13 0138 10/25/13 0156   10/24/13 2200  metroNIDAZOLE (FLAGYL) IVPB 500 mg  Status:  Discontinued     500 mg 100 mL/hr over 60 Minutes Intravenous Every 8 hours 10/24/13 2116 10/26/13 1112   10/24/13 2200  ciprofloxacin (CIPRO) IVPB 400 mg     400 mg 200 mL/hr over 60 Minutes Intravenous  Once 10/24/13 2117 10/24/13 2307       Assessment: 80yo male with Gastroenteritis and continued soft BP despite fluid resuscitation.  Cr < 1 and dosing is appropriate.  Flagyl was stopped today.  Blood cx are NTD.  Urine cux, OP, RVP and CDiff are all negative.  Goal of Therapy:  Resolution of infection  Plan:  1-  Continue Cipro $RemoveBefore'400mg'[id number]$  IV q12 2-  F/U blood cultures  Gracy Bruins, PharmD [facility]

## 2013-10-26 NOTE — Clinical Documentation Improvement (Signed)
Presents with hypotension, hypovolemic shock and gastroenteritis. Nutritional Consult notes severe malnutrition in context of chronic illness. Not noted on Problem List.   Recommending high kcal, high protein foods  BMI = 20.44  Recommending Ensure Complete BID  Please clarify if you agree with Nutritional Consult and render your opinion in next progress note and discharge summary.    Thank You, Shellee MiloEileen T Chalee Hirota ,RN Clinical Documentation Specialist:  [phone]  [facility]- Health Information Management

## 2013-10-26 NOTE — Progress Notes (Signed)
Pt transferred to room 6N32 in bed. Alert and oriented x3.Oriented to room and call bell.

## 2013-10-27 DIAGNOSIS — I10 Essential (primary) hypertension: Secondary | ICD-10-CM

## 2013-10-27 DIAGNOSIS — I5032 Chronic diastolic (congestive) heart failure: Secondary | ICD-10-CM

## 2013-10-27 DIAGNOSIS — I509 Heart failure, unspecified: Secondary | ICD-10-CM

## 2013-10-27 DIAGNOSIS — N179 Acute kidney failure, unspecified: Secondary | ICD-10-CM

## 2013-10-27 LAB — CBC
HCT: 29 % — ABNORMAL LOW (ref 39.0–52.0)
Hemoglobin: 10.2 g/dL — ABNORMAL LOW (ref 13.0–17.0)
MCH: 32.8 pg (ref 26.0–34.0)
MCHC: 35.2 g/dL (ref 30.0–36.0)
MCV: 93.2 fL (ref 78.0–100.0)
Platelets: 139 10*3/uL — ABNORMAL LOW (ref 150–400)
RBC: 3.11 MIL/uL — AB (ref 4.22–5.81)
RDW: 14 % (ref 11.5–15.5)
WBC: 6 10*3/uL (ref 4.0–10.5)

## 2013-10-27 LAB — BASIC METABOLIC PANEL
BUN: 16 mg/dL (ref 6–23)
CO2: 20 meq/L (ref 19–32)
CREATININE: 0.9 mg/dL (ref 0.50–1.35)
Calcium: 7.7 mg/dL — ABNORMAL LOW (ref 8.4–10.5)
Chloride: 108 mEq/L (ref 96–112)
GFR calc non Af Amer: 77 mL/min — ABNORMAL LOW (ref >90)
GFR, EST AFRICAN AMERICAN: 89 mL/min — AB (ref >90)
Glucose, Bld: 101 mg/dL — ABNORMAL HIGH (ref 70–99)
POTASSIUM: 3.8 meq/L (ref 3.7–5.3)
SODIUM: 138 meq/L (ref 137–147)

## 2013-10-27 MED ORDER — CIPROFLOXACIN HCL 500 MG PO TABS
500.0000 mg | ORAL_TABLET | Freq: Two times a day (BID) | ORAL | Status: DC
  Administered 2013-10-27 – 2013-10-30 (×6): 500 mg via ORAL
  Filled 2013-10-27 (×10): qty 1

## 2013-10-27 NOTE — Progress Notes (Signed)
Name: Alexander Dorsey MRN: [id number] DOB: 1929/03/28    ADMISSION DATE:  10/24/2013 CONSULTATION DATE:  2/11  PRIMARY SERVICE: PCCM   CHIEF COMPLAINT:  Nausea, vomiting, and hypotension   BRIEF PATIENT DESCRIPTION:  26 yom admitted from outside hospital on 2/11 w/ dx of gastroenteritis, and persistent hypotension in spite of 4 liters fluid resuscitation efforts.   SIGNIFICANT EVENTS: 2/11 Admit to ICU 2/12 Transfer to medical floor   STUDIES:  CT abd 2/11: + gastroenteritis. Diverticular disease w/out diverticulitis   LINES / TUBES: R IJ TLC 2/11> out 2/13  CULTURES: BCX2 2/11>>> UC 2/11>>>neg Stool culture 2/11>>> Stool O&P 2/11>>>neg cdiff 2/11>>>neg resp viral panel 2/11>>>neg  ANTIBIOTICS: Cipro 2/11>>> Flagyl 2/11>>>2/13  SUBJECTIVE:  Comfortable at rest but very congested cough and PT feels will need SNF  VITAL SIGNS: Temp:  [97.6 F (36.4 C)-98.9 F (37.2 C)] 97.6 F (36.4 C) (02/14 0512) Pulse Rate:  [62-71] 62 (02/14 0512) Resp:  [18-20] 20 (02/14 0512) BP: (116-146)/(60-64) 146/64 mmHg (02/14 0512) SpO2:  [94 %-98 %] 94 % (02/14 0512) Weight:  [121 lb 14.6 oz (55.3 kg)-126 lb 15.8 oz (57.6 kg)] 126 lb 15.8 oz (57.6 kg) (02/14 0512) INTAKE / OUTPUT: Intake/Output     02/13 0701 - 02/14 0700 02/14 0701 - 02/15 0700   P.O. 240 240   I.V. (mL/kg)     IV Piggyback 400    Total Intake(mL/kg) 640 (11.1) 240 (4.2)   Urine (mL/kg/hr)     Total Output       Net +640 +240        Urine Occurrence 2 x    Stool Occurrence 5 x     PHYSICAL EXAMINATION: General:  No acute distress   Neuro:  Awake, oriented, no focal def  HEENT:  Mm dry, no JVD  Cardiovascular:  rrr Lungs:  Clear  Abdomen:  Soft, non-tender, no OM  Musculoskeletal:  Intact  Skin:  Dry intact   LABS:  CBC  Recent Labs Lab 10/25/13 0420 10/26/13 0620 10/27/13 0430  WBC 5.4 6.1 6.0  HGB 9.9* 9.4* 10.2*  HCT 28.9* 26.9* 29.0*  PLT 135* 128* 139*   Coag's  Recent  Labs Lab 10/24/13 2108  INR 1.15   BMET  Recent Labs Lab 10/25/13 0420 10/26/13 0620 10/27/13 0430  NA 138 136* 138  K 3.5* 3.5* 3.8  CL 106 106 108  CO2 21 21 20   BUN 19 19 16   CREATININE 0.93 0.92 0.90  GLUCOSE 139* 126* 101*   Electrolytes  Recent Labs Lab 10/24/13 2108 10/25/13 0420 10/26/13 0620 10/27/13 0430  CALCIUM 7.3* 7.2* 7.4* 7.7*  MG 1.4*  --  1.9  --   PHOS 2.1*  --  2.2*  --    Sepsis Markers  Recent Labs Lab 10/24/13 2103 10/24/13 2108  LATICACIDVEN 1.6  --   PROCALCITON  --  0.98   Liver Enzymes  Recent Labs Lab 10/24/13 2108  AST 33  ALT 13  ALKPHOS 49  BILITOT 0.5  ALBUMIN 2.7*   Glucose  Recent Labs Lab 10/24/13 1951  GLUCAP 102*    Imaging Dg Chest Port 1 View  10/26/2013   CLINICAL DATA:  Cough, pulmonary edema  EXAM: PORTABLE CHEST - 1 VIEW  COMPARISON:  DG CHEST 1V PORT dated 10/25/2013  FINDINGS: Low lung volumes. The cardiac silhouette is thinning upper limits normal. A right-sided internal jugular catheter is appreciated with tip projecting region superior vena cava. There is mild prominence  of the interstitial markings. Mild area of increased density projects within the lingula region. The osseous structures are unremarkable. When compared to the previous study there is decreased conspicuity of the interstitial prominence.  IMPRESSION: Decreased conspicuity of the prominent interstitial markings. Differential considerations include resolving pulmonary edema versus a resolving infectious or inflammatory interstitial infiltrate. Atelectasis versus infiltrate left lung base.   Electronically Signed   By: Salome HolmesHector  Cooper M.D.   On: 10/26/2013 08:01   ASSESSMENT / PLAN:  PULMONARY A:  Interstitial edema >> improved. Atelectasis. P:   - Titrate O2 for sat of 88-92%. - Pulmonary hygiene    CARDIOVASCULAR A:   Hypovolemic shock. +/- septic shock vs adrenal insuff (cortisol 13.6 from 2/13) in setting of gastroenteritis  (resolved). Hx of HTN, CAD, hyperlipidemia. - echo 2/13 Features are consistent with a pseudonormal left ventricular filling pattern, with concomitant abnormal relaxation and increased filling pressure (grade 2 diastolic dysfunction).  P:  - KVO IVF. - off steroids 2/13. - Holding  enalapril, mevacor for now    RENAL A:   AKI >> improving. P:   - Trend chemistry. - Holding ACE-I. - Replace electrolytes as needed.  GASTROINTESTINAL A:   Nausea/ vomiting from gastroenteritis. Hx of diverticular disease but no diverticulitis.  Protein cal malnutrition  P:   - regular diet  HEMATOLOGIC A:   Mild anemia w/ no evidence of bleeding. PT is Jehovah's witness.  P:  - [place] heparin for DVT prevention - Labs via pediatric draw. - No blood products   INFECTIOUS A: Gastro-enteritis ? Viral  - see dashboad,  rx with cipro is empiric > changed to po 2/14 with plans to DC 2/19  ENDOCRINE A:   Hx of hypothyroidism. P:   - Continue synthroid.  NEUROLOGIC A:   H/o restless leg. P:   - Requip     Sandrea HughsMichael Lavontae Cornia, MD Pulmonary and Critical Care Medicine [facility] Cell [phone] After 5:30 PM or weekends, call [phone]

## 2013-10-28 DIAGNOSIS — E876 Hypokalemia: Secondary | ICD-10-CM

## 2013-10-28 NOTE — Progress Notes (Signed)
Name: Alexander Dorsey MRN: [id number] DOB: Feb 15, 1929    ADMISSION DATE:  10/24/2013 CONSULTATION DATE:  2/11  PRIMARY SERVICE: PCCM   CHIEF COMPLAINT:  Nausea, vomiting, and hypotension   BRIEF PATIENT DESCRIPTION:  68 yom admitted from outside hospital on 2/11 w/ dx of gastroenteritis, and persistent hypotension in spite of 4 liters fluid resuscitation efforts.    SIGNIFICANT EVENTS: 2/11 Admit to ICU 2/12 Transfer to medical floor 2/15 > mild drug rash post trunk > ant trunk   STUDIES:  CT abd 2/11: + gastroenteritis. Diverticular disease w/out diverticulitis   LINES / TUBES: R IJ TLC 2/11> out 2/13  CULTURES: BCX2 2/11>>> UC 2/11>>>neg Stool culture 2/11>>> Stool O&P 2/11>>>neg cdiff 2/11>>>neg resp viral panel 2/11>>>neg  ANTIBIOTICS: Cipro 2/11>>> Flagyl 2/11>>>2/13  SUBJECTIVE:  Comfortable amb to bathroom with assistance on RA s increased wob  PT feels will need SNF  VITAL SIGNS: Temp:  [98.2 F (36.8 C)-98.9 F (37.2 C)] 98.2 F (36.8 C) (02/15 0448) Pulse Rate:  [64-69] 64 (02/15 0448) Resp:  [16-18] 16 (02/15 0448) BP: (141-153)/(67-69) 149/69 mmHg (02/15 0448) SpO2:  [98 %-99 %] 99 % (02/15 0448) Weight:  [125 lb 7.1 oz (56.9 kg)] 125 lb 7.1 oz (56.9 kg) (02/15 0448) INTAKE / OUTPUT: Intake/Output     02/14 0701 - 02/15 0700 02/15 0701 - 02/16 0700   P.O. 485 360   IV Piggyback     Total Intake(mL/kg) 485 (8.5) 360 (6.3)   Urine (mL/kg/hr) 650 (0.5)    Total Output 650     Net -165 +360        Urine Occurrence 3 x 3 x   Stool Occurrence 3 x 1 x    PHYSICAL EXAMINATION: General:  No acute distress   Neuro:  Awake, oriented, no focal def  HEENT:  Mm dry, no JVD  Cardiovascular:  rrr Lungs:  Clear  Abdomen:  Soft, non-tender, no OM  Musculoskeletal:  Intact  Skin:  Dry intact   LABS:  CBC  Recent Labs Lab 10/25/13 0420 10/26/13 0620 10/27/13 0430  WBC 5.4 6.1 6.0  HGB 9.9* 9.4* 10.2*  HCT 28.9* 26.9* 29.0*  PLT 135*  128* 139*   Coag's  Recent Labs Lab 10/24/13 2108  INR 1.15   BMET  Recent Labs Lab 10/25/13 0420 10/26/13 0620 10/27/13 0430  NA 138 136* 138  K 3.5* 3.5* 3.8  CL 106 106 108  CO2 21 21 20   BUN 19 19 16   CREATININE 0.93 0.92 0.90  GLUCOSE 139* 126* 101*   Electrolytes  Recent Labs Lab 10/24/13 2108 10/25/13 0420 10/26/13 0620 10/27/13 0430  CALCIUM 7.3* 7.2* 7.4* 7.7*  MG 1.4*  --  1.9  --   PHOS 2.1*  --  2.2*  --    Sepsis Markers  Recent Labs Lab 10/24/13 2103 10/24/13 2108  LATICACIDVEN 1.6  --   PROCALCITON  --  0.98   Liver Enzymes  Recent Labs Lab 10/24/13 2108  AST 33  ALT 13  ALKPHOS 49  BILITOT 0.5  ALBUMIN 2.7*   Glucose  Recent Labs Lab 10/24/13 1951  GLUCAP 102*    Imaging No results found. ASSESSMENT / PLAN:  PULMONARY A:  Interstitial edema >> improved. Atelectasis. Acute respiratory failure/ resolved P:     - Pulmonary hygiene    CARDIOVASCULAR A:   Hypovolemic shock. +/- septic shock vs adrenal insuff (cortisol 13.6 from 2/13) in setting of gastroenteritis (resolved). Hx of HTN, CAD, hyperlipidemia. -  echo 2/13 Features are consistent with a pseudonormal left ventricular filling pattern, with concomitant abnormal relaxation and increased filling pressure (grade 2 diastolic dysfunction).  P:  - KVO IVF. - off steroids 2/13. - Holding  enalapril, mevacor for now    RENAL A:   AKI >> resolved  Hypokalemia, resolved P:    - Holding ACE-I. - Replace electrolytes as needed.  GASTROINTESTINAL A:   Nausea/ vomiting from gastroenteritis, resolved  Hx of diverticular disease but no diverticulitis.  Protein cal malnutrition  P:   - regular diet  HEMATOLOGIC A:   Mild anemia w/ no evidence of bleeding. PT is Jehovah's witness.  P:  - Palmer heparin for DVT prevention - Labs via pediatric draw. - No blood products   INFECTIOUS A: Gastro-enteritis ? Viral  - see dashboad,  rx with cipro is  empiric > changed to po 2/14 with plans to DC 2/19  ENDOCRINE A:   Hx of hypothyroidism. P:   - Continue synthroid.  NEUROLOGIC A:   H/o restless leg. P:   - Requip     Sandrea HughsMichael Wert, MD Pulmonary and Critical Care Medicine [facility] Cell [phone] After 5:30 PM or weekends, call [phone]

## 2013-10-28 NOTE — Progress Notes (Signed)
Family has investigated places in [place] and now requests [facility] for placement.

## 2013-10-28 NOTE — Progress Notes (Signed)
Texted Alexander JakschSarah Jeffries CM, regarding family request for [facility] in [place] for Short term rehab once discharged.

## 2013-10-28 NOTE — Progress Notes (Signed)
Spoke with Case manager, Freddy JakschSarah Jeffries who will relay information to SW about preferences on placement.  Family would also like to request EMS for transport to the facility if possible.

## 2013-10-28 NOTE — Progress Notes (Signed)
Family is requesting pt to go to [facility] in [place], [place] for [place] term rehab.

## 2013-10-29 DIAGNOSIS — R5381 Other malaise: Secondary | ICD-10-CM

## 2013-10-29 LAB — STOOL CULTURE: Special Requests: NORMAL

## 2013-10-29 MED ORDER — ENOXAPARIN SODIUM 40 MG/0.4ML ~~LOC~~ SOLN
40.0000 mg | SUBCUTANEOUS | Status: DC
  Administered 2013-10-29: 40 mg via SUBCUTANEOUS
  Filled 2013-10-29 (×2): qty 0.4

## 2013-10-29 NOTE — Progress Notes (Signed)
Clinical Social Work Department BRIEF PSYCHOSOCIAL ASSESSMENT 10/29/2013  Patient:  Alexander Dorsey,Alexander Dorsey     Account Number:  [id number]     Admit date:  10/24/2013  Clinical Social Worker:  Hadley PenRINKARD,Havier Deeb, LCSWA  Date/Time:  10/29/2013 12:21 PM  Referred by:  Physician  Date Referred:  10/29/2013 Referred for  SNF Placement   Other Referral:   Interview type:  Family Other interview type:   CSW spoke with patient's daughter Alexander Dorsey [phone]    PSYCHOSOCIAL DATA Living Status:  SIGNIFICANT OTHER Admitted from facility:   Level of care:   Primary support name:  Alexander Dorsey [phone] Primary support relationship to patient:  CHILD, ADULT Degree of support available:   Strong    CURRENT CONCERNS Current Concerns  Post-Acute Placement   Other Concerns:    SOCIAL WORK ASSESSMENT / PLAN CSW attempted to assess patient for SNF at discharge. Patient was in the restroom during time of assessment, but patient's daughter Alexander Dorsey present at the bedside. CSW informed daughter that PT is being recommended for patient, daughter states that patient and family aware. Daughter reports that preference facility is [facility] [place]. Patient would prefer a private room if available. CSW informed daughter that CSW will intiate referral and update patient/family once bed offers are received. Daughter thanked CSW for assistance, has no questions at this time.   Assessment/plan status:  Information/Referral to [facility] Other assessment/ plan:   Information/referral to community resources:   CSW will intiate SNF referral to [facility] and [facility].    PATIENT'S/FAMILY'S RESPONSE TO PLAN OF CARE: Patient not present during assessment, daughter is agreeable to SNF and reports that patient is as well.     [name], MSW, LCSWA Clinical Social Worker [facility]. [phone]

## 2013-10-29 NOTE — Progress Notes (Addendum)
Clinical Social Work Department CLINICAL SOCIAL WORK PLACEMENT NOTE 10/29/2013  Patient:  Alexander Dorsey,Alexander Dorsey  Account Number:  [id number] Admit date:  10/24/2013  Clinical Social Worker:  Samuella BruinKRISTIN DRINKARD, Theresia MajorsLCSWA  Date/time:  10/29/2013 01:42 PM  Clinical Social Work is seeking post-discharge placement for this patient at the following level of care:   SKILLED NURSING   (*CSW will update this form in Epic as items are completed)     Patient/family provided with [facility] Department of Clinical Social Works list of facilities offering this level of care within the geographic area requested by the patient (or if unable, by the patients family).  10/29/2013  Patient/family informed of their freedom to choose among providers that offer the needed level of care, that participate in Medicare, Medicaid or managed care program needed by the patient, have an available bed and are willing to accept the patient.    Patient/family informed of [facility] ownership interest in [facility], as well as of the fact that they are under no obligation to receive care at this facility.  PASARR submitted to EDS on N/A- [place] [facility] PASARR number received from EDS on   FL2 transmitted to all facilities in geographic area requested by pt/family on  10/29/2013 FL2 transmitted to all facilities within larger geographic area on   Patient informed that his/her managed care company has contracts with or will negotiate with  certain facilities, including the following:     Patient/family informed of bed offers received:  10/30/2013 Patient chooses bed at [facility] Physician recommends and patient chooses bed at    Patient to be transferred to  on  10/30/2013 Patient [facility] be [facility]  The following physician request were entered in Epic:   Additional Comments:  Samuella BruinKristin Drinkard, MSW, LCSWA Clinical Social Worker [facility] Emergency Dept. [phone]  Darlyn ChamberEmily  [place], Theresia MajorsLCSWA Clinical Social Worker [phone]

## 2013-10-29 NOTE — Progress Notes (Signed)
CSW faxed patient referral to [facility] and [facility] in [place]. CSW will continue to follow for discharge planning to SNF.  Samuella BruinKristin Armilda Vanderlinden, MSW, [facility] Clinical Social Worker [phone]

## 2013-10-29 NOTE — Progress Notes (Signed)
Physical Therapy Treatment Patient Details Name: Darius Bumprvin Lortz MRN: [id number] DOB: [birth year] Today's Date: 10/29/2013 Time: 1000-1026 PT Time Calculation (min): 26 min  PT Assessment / Plan / Recommendation  History of Present Illness 80 y.o. male [facility] with dx of gastroenteritis, and persistent hypotension in spite of 4 liters fluid resuscitation efforts.     PT Comments   Pt improving mobility and activity tolerance today.  Pt eager for D/C to SNF for rehab and continue working on mobility.    Follow Up Recommendations  SNF     Does the patient have the potential to tolerate intense rehabilitation     Barriers to Discharge        Equipment Recommendations  Rolling walker with 5" wheels    Recommendations for Other Services    Frequency Min 3X/week   Progress towards PT Goals Progress towards PT goals: Progressing toward goals  Plan Current plan remains appropriate    Precautions / Restrictions Precautions Precautions: Fall Precaution Comments: pt with h/o 3 falls in the past few months.   Restrictions Weight Bearing Restrictions: No   Pertinent Vitals/Pain Denied pain.      Mobility  Bed Mobility Overal bed mobility: Modified Independent General bed mobility comments: Needs increased time and use of bed rails, but able to complete without A and demos good technique.   Transfers Overall transfer level: Needs assistance Equipment used: Rolling walker (2 wheeled) Transfers: Sit to/from Stand Sit to Stand: Min assist General transfer comment: cues for UE use and scooting to EOB prior to standing.   Ambulation/Gait Ambulation/Gait assistance: Min guard Ambulation Distance (Feet): 150 Feet Assistive device: Rolling walker (2 wheeled) Gait Pattern/deviations: Step-through pattern;Decreased stride length;Trunk flexed General Gait Details: cues for upright posture and encouragement.      Exercises     PT Diagnosis:    PT Problem List:   PT  Treatment Interventions:     PT Goals (current goals can now be found in the care plan section) Acute Rehab PT Goals Patient Stated Goal: to get stronger so he can go home Time For Goal Achievement: 11/09/13 Potential to Achieve Goals: Good  Visit Information  Last PT Received On: 10/29/13 Assistance Needed: +1 History of Present Illness: 80 y.o. male [facility] with dx of gastroenteritis, and persistent hypotension in spite of 4 liters fluid resuscitation efforts.      Subjective Data  Patient Stated Goal: to get stronger so he can go home   Cognition  Cognition Arousal/Alertness: Awake/alert Behavior During Therapy: WFL for tasks assessed/performed Overall Cognitive Status: Within Functional Limits for tasks assessed    Balance  Balance Overall balance assessment: Needs assistance Standing balance support: Bilateral upper extremity supported Standing balance-Leahy Scale: Poor  End of Session PT - End of Session Equipment Utilized During Treatment: Gait belt Activity Tolerance: Patient limited by fatigue Patient left: in chair;with call bell/phone within reach;with family/visitor present Nurse Communication: Mobility status   GP     Sunny SchleinRitenour, Marysa Wessner F, [place] [phone] 10/29/2013, 10:35 AM

## 2013-10-29 NOTE — Progress Notes (Signed)
Name: Alexander Dorsey MRN: [id number] DOB: 1929/01/15    ADMISSION DATE:  10/24/2013 CONSULTATION DATE:  2/11  PRIMARY SERVICE: PCCM   CHIEF COMPLAINT:  Nausea, vomiting, and hypotension   BRIEF PATIENT DESCRIPTION:  50 yom admitted from outside hospital on 2/11 w/ dx of gastroenteritis, and persistent hypotension in spite of 4 liters fluid resuscitation efforts.    SIGNIFICANT EVENTS: 2/11 Admit to ICU 2/12 Transfer to medical floor 2/15 > mild drug rash post trunk > ant trunk 2/16 ready for DC, [name] [place]  STUDIES:  CT abd 2/11: + gastroenteritis. Diverticular disease w/out diverticulitis   LINES / TUBES: R IJ TLC 2/11> out 2/13  CULTURES: BCX2 2/11>>> UC 2/11>>>neg Stool culture 2/11>>> Stool O&P 2/11>>>neg cdiff 2/11>>>neg resp viral panel 2/11>>>neg  ANTIBIOTICS: Cipro 2/11>>> Flagyl 2/11>>>2/13  SUBJECTIVE:  Ambulating with PT  VITAL SIGNS: Temp:  [98.2 F (36.8 C)-98.4 F (36.9 C)] 98.4 F (36.9 C) (02/16 0500) Pulse Rate:  [62-71] 64 (02/16 0500) Resp:  [16-18] 18 (02/16 0500) BP: (142-144)/(64-74) 144/64 mmHg (02/16 0500) SpO2:  [97 %-99 %] 97 % (02/16 0500) Weight:  [56.5 kg (124 lb 9 oz)] 56.5 kg (124 lb 9 oz) (02/16 0500) INTAKE / OUTPUT: Intake/Output     02/15 0701 - 02/16 0700 02/16 0701 - 02/17 0700   P.O. 1080 125   Total Intake(mL/kg) 1080 (19.1) 125 (2.2)   Urine (mL/kg/hr)     Total Output       Net +1080 +125        Urine Occurrence 9 x    Stool Occurrence 5 x     PHYSICAL EXAMINATION: General:  No acute distress   Neuro:  Awake, oriented, no focal def  HEENT:  Mm dry, no JVD  Cardiovascular:  rrr Lungs:  Clear  Abdomen:  Soft, non-tender, no OM  Musculoskeletal:  Intact  Skin:  Dry intact   LABS:  CBC  Recent Labs Lab 10/25/13 0420 10/26/13 0620 10/27/13 0430  WBC 5.4 6.1 6.0  HGB 9.9* 9.4* 10.2*  HCT 28.9* 26.9* 29.0*  PLT 135* 128* 139*   Coag's  Recent Labs Lab  10/24/13 2108  INR 1.15   BMET  Recent Labs Lab 10/25/13 0420 10/26/13 0620 10/27/13 0430  NA 138 136* 138  K 3.5* 3.5* 3.8  CL 106 106 108  CO2 21 21 20   BUN 19 19 16   CREATININE 0.93 0.92 0.90  GLUCOSE 139* 126* 101*   Electrolytes  Recent Labs Lab 10/24/13 2108 10/25/13 0420 10/26/13 0620 10/27/13 0430  CALCIUM 7.3* 7.2* 7.4* 7.7*  MG 1.4*  --  1.9  --   PHOS 2.1*  --  2.2*  --    Sepsis Markers  Recent Labs Lab 10/24/13 2103 10/24/13 2108  LATICACIDVEN 1.6  --   PROCALCITON  --  0.98   Liver Enzymes  Recent Labs Lab 10/24/13 2108  AST 33  ALT 13  ALKPHOS 49  BILITOT 0.5  ALBUMIN 2.7*   Glucose  Recent Labs Lab 10/24/13 1951  GLUCAP 102*    Imaging No results found. ASSESSMENT / PLAN:  PULMONARY A:  Interstitial edema >> improved. Atelectasis. Acute respiratory failure/ resolved P:     - Pulmonary hygiene    CARDIOVASCULAR A:   Hypovolemic shock. +/- septic shock vs adrenal insuff (cortisol 13.6 from 2/13) in setting of gastroenteritis (resolved). Hx of HTN, CAD, hyperlipidemia. - echo 2/13 Features are consistent with a pseudonormal left ventricular filling  pattern, with concomitant abnormal relaxation and increased filling pressure (grade 2 diastolic dysfunction).  P:  - KVO IVF. - off steroids 2/13. - Holding  enalapril, mevacor for now    RENAL A:   AKI >> resolved  Hypokalemia, resolved P:    - Holding ACE-I. - Replace electrolytes as needed.  GASTROINTESTINAL A:   Nausea/ vomiting from gastroenteritis, resolved  Hx of diverticular disease but no diverticulitis.  Protein cal malnutrition  P:   - regular diet  HEMATOLOGIC A:   Mild anemia w/ no evidence of bleeding. PT is Jehovah's witness.  P:  - [name] heparin for DVT prevention - Labs via pediatric draw. - No blood products   INFECTIOUS A: Gastro-enteritis ? Viral  - see dashboad,  rx with cipro is empiric > changed to po 2/14 with plans to DC  2/19  ENDOCRINE A:   Hx of hypothyroidism. P:   - Continue synthroid.  NEUROLOGIC A:   H/o restless leg. P:   - Requip   Global: Ready for DC to SNF see family preference. He will need ambulance to travel [place] [name].  Brett CanalesSteve Minor ACNP [facility] PCCM Pager [phone] till 3 pm If no answer page [phone] 10/29/2013, 10:13 AM    PCCM ATTENDING: I have interviewed and examined the patient and reviewed the database. I have formulated the assessment and plan as reflected in the note above with amendments made by me.  Billy Fischeravid Jariah Jarmon, MD;  PCCM service; Mobile [phone]

## 2013-10-30 DIAGNOSIS — G2581 Restless legs syndrome: Secondary | ICD-10-CM

## 2013-10-30 MED ORDER — CIPROFLOXACIN HCL 500 MG PO TABS: 500.0000 mg | ORAL_TABLET | Freq: Two times a day (BID) | ORAL | Status: AC

## 2013-10-30 MED ORDER — ENSURE COMPLETE PO LIQD: 237.0000 mL | Freq: Two times a day (BID) | ORAL | Status: AC

## 2013-10-30 NOTE — Clinical Social Work Note (Signed)
[facility] has confirmed bed availability for 10/30/2013. CSW has spoken to both pt and pt's wife (via phone) at pt's bedside. Pt and pt's wife agreeable to SN[facility] placement on [facility]30/2013 [facility]. CSW to call pt's daughter regarding transportation needs for pt. CSW to hard fax discharge summary to [facility]. CSW to complete discharge packet and place on pt's shadow chart. CSW to assist with discharge transportation needs, if needed.   Darlyn ChamberEmily [place], LCSWA Clinical Social Worker [phone]

## 2013-10-30 NOTE — Discharge Summary (Signed)
Physician Discharge Summary  Patient ID: Alexander Dorsey MRN: [id number] DOB/AGE: 78-Apr-1930 78 y.o.  Admit date: 10/24/2013 Discharge date: 10/30/2013  Problem List Active Problems:   Septic shock   Gastroenteritis   Acute renal failure   Essential hypertension, benign   Diastolic CHF, chronic   Hypokalemia   Physical deconditioning   Restless leg  HPI: This is a 80 year old male admitted in transfer from outside hospital on 2/11 w/ CC: 1 day h/o nausea, and vomiting w/ poor PO intake. Also c/o weight loss, fevers, and indigestion.  Apparently wife had similar illness after contact with sick hair dresser the day prior.   Hospital Course: Initially seen in ED at [facility]. Was profoundly hypotensive, treated w/ 4 liters crystalloid w/ no improvement. CT abd c/w gastroenteritis. Started on levophed gtt and transferred to [facility]. Admitted for hypovolemic shock and spent one night in ICU. 2/12 he no longer required vasopressors and was transferred to medical floor. He slowly improved and on 2/16 was deemed a candidate for discharge to SNF. Discharge planned for 2/17  SIGNIFICANT EVENTS:  2/11 > Admit to ICU  2/12 > Transfer to medical floor  2/15 > mild drug rash post trunk > ant trunk  2/16 > ready for DC, Family prefers [facility] in [place].   STUDIES:  CT abd 2/11: + gastroenteritis. Diverticular disease w/out diverticulitis  Echo 2/13: Features are consistent with a pseudonormal left ventricular filling pattern, with concomitant abnormal relaxation and increased filling pressure (grade 2 diastolic dysfunction).   LINES / TUBES:  R IJ TLC 2/11> out 2/13   CULTURES:  BCX2 2/11 >>> Pending - NGTD UC 2/11>>>neg  Stool culture 2/11>>>  Stool O&P 2/11>>>neg  cdiff 2/11>>>neg  resp viral panel 2/11>>>neg   ANTIBIOTICS:  Cipro 2/11 > 2/19 (planned) Flagyl 2/11 > 2/13   Labs at discharge Lab Results  Component Value Date   CREATININE 0.90 10/27/2013   BUN 16 10/27/2013   NA 138 10/27/2013   K 3.8 10/27/2013   CL 108 10/27/2013   CO2 20 10/27/2013   Lab Results  Component Value Date   WBC 6.0 10/27/2013   HGB 10.2* 10/27/2013   HCT 29.0* 10/27/2013   MCV 93.2 10/27/2013   PLT 139* 10/27/2013   Lab Results  Component Value Date   ALT 13 10/24/2013   AST 33 10/24/2013   ALKPHOS 49 10/24/2013   BILITOT 0.5 10/24/2013   Lab Results  Component Value Date   INR 1.15 10/24/2013   Vital signs at Discharge. Temp:  [98 F (36.7 C)-98.1 F (36.7 C)] 98 F (36.7 C) (02/17 0610) Pulse Rate:  [65] 65 (02/17 0610) Resp:  [18-20] 20 (02/17 0610) BP: (126-146)/(64-72) 126/64 mmHg (02/17 0610) SpO2:  [98 %-100 %] 98 % (02/17 0610) Weight:  [56 kg (123 lb 7.3 oz)] 56 kg (123 lb 7.3 oz) (02/17 0610)  ASSESSMENT / PLAN:   PULMONARY  A:  Interstitial edema >> improved.  Atelectasis.  Acute respiratory failure/ resolved  P:  - Pulmonary hygiene   CARDIOVASCULAR  A:  Hypovolemic shock. +/- septic shock vs adrenal insuff (cortisol 13.6 from 2/13) in setting of gastroenteritis (resolved).  Hx of HTN, CAD, hyperlipidemia.   P:   - off steroids 2/13.  - Holding enalapril, mevacor for now. May need to be restated in future.  RENAL  A:  AKI >> resolved  Hypokalemia, resolved   P:  - Holding ACE-I.  - Replace electrolytes as needed.   GASTROINTESTINAL  A:  Nausea/ vomiting from gastroenteritis, resolved  Hx of diverticular disease but no diverticulitis.  Protein cal malnutrition   P:  - regular diet   HEMATOLOGIC  A:  Mild anemia w/ no evidence of bleeding.  PT is Jehovah's witness.   P:  - Labs via pediatric draw.  - No blood products   INFECTIOUS  A:  Gastro-enteritis ? Viral   P: -cipro is empiric > changed to po 2/14 with plans to DC 2/19   ENDOCRINE  A:  Hx of hypothyroidism.   P:  - Continue synthroid.  -Consider repeat TSH in future.  NEUROLOGIC  A:  H/o restless leg.   P:  - Requip   CLINICAL DATA: Cough, pulmonary  edema  EXAM:  PORTABLE CHEST - 1 VIEW  COMPARISON: DG CHEST 1V PORT dated 10/25/2013  FINDINGS:  Low lung volumes. The cardiac silhouette is thinning upper limits  normal. A right-sided internal jugular catheter is appreciated with  tip projecting region superior vena cava. There is mild prominence  of the interstitial markings. Mild area of increased density  projects within the lingula region. The osseous structures are  unremarkable. When compared to the previous study there is decreased  conspicuity of the interstitial prominence.  IMPRESSION:  Decreased conspicuity of the prominent interstitial markings.  Differential considerations include resolving pulmonary edema versus  a resolving infectious or inflammatory interstitial infiltrate.  Atelectasis versus infiltrate left lung base.    Disposition:  Final discharge disposition not confirmed      Discharge Orders   Future Orders Complete By Expires   Discharge to SNF when bed available  As directed        Medication List    STOP taking these medications       enalapril 10 MG tablet  Commonly known as:  VASOTEC     lovastatin 10 MG tablet  Commonly known as:  MEVACOR     NARCOSOFT HERBAL LAX PO     tiZANidine 4 MG tablet  Commonly known as:  ZANAFLEX     traMADol 200 MG 24 hr tablet  Commonly known as:  ULTRAM-ER      TAKE these medications       cholecalciferol 1000 UNITS tablet  Commonly known as:  VITAMIN D  Take 1,000 Units by mouth daily.     ciprofloxacin 500 MG tablet  Commonly known as:  CIPRO  Take 1 tablet (500 mg total) by mouth 2 (two) times daily.     co-enzyme Q-10 50 MG capsule  Take 50 mg by mouth daily.     feeding supplement (ENSURE COMPLETE) Liqd  Take 237 mLs by mouth 2 (two) times daily between meals.     levothyroxine 25 MCG tablet  Commonly known as:  SYNTHROID, LEVOTHROID  Take 25 mcg by mouth daily before breakfast.     rOPINIRole 0.5 MG tablet  Commonly known as:   REQUIP  Take 0.5 mg by mouth at bedtime.     VITAMIN B 12 PO  Take 1 tablet by mouth daily.          Discharged Condition: fair  Time spent on discharge greater than 40 minutes.  Office follow up Special Information or instructions. Per Nursing home. Signed: Joneen RoachPaul Hoffman, ACNP [facility]/Critical [facility] Pager [phone] or [phone]    Billy Fischeravid Simonds, MD ; [facility] service Mobile [phone].  After 5:30 PM or weekends, call [phone]

## 2013-10-30 NOTE — Progress Notes (Signed)
Patient discharged to SNF via EMS transport. Report called to facility [facility]) RN.

## 2013-10-31 LAB — CULTURE, BLOOD (ROUTINE X 2)
CULTURE: NO GROWTH
Culture: NO GROWTH

## 2015-06-09 IMAGING — DX DG CHEST 1V PORT
1 series · 1 of 1 positions shown · non-contrast
Comparison: Chest x-ray from yesterday

CLINICAL DATA: Productive cough and congestion

EXAM:
PORTABLE CHEST - 1 VIEW

[portable]
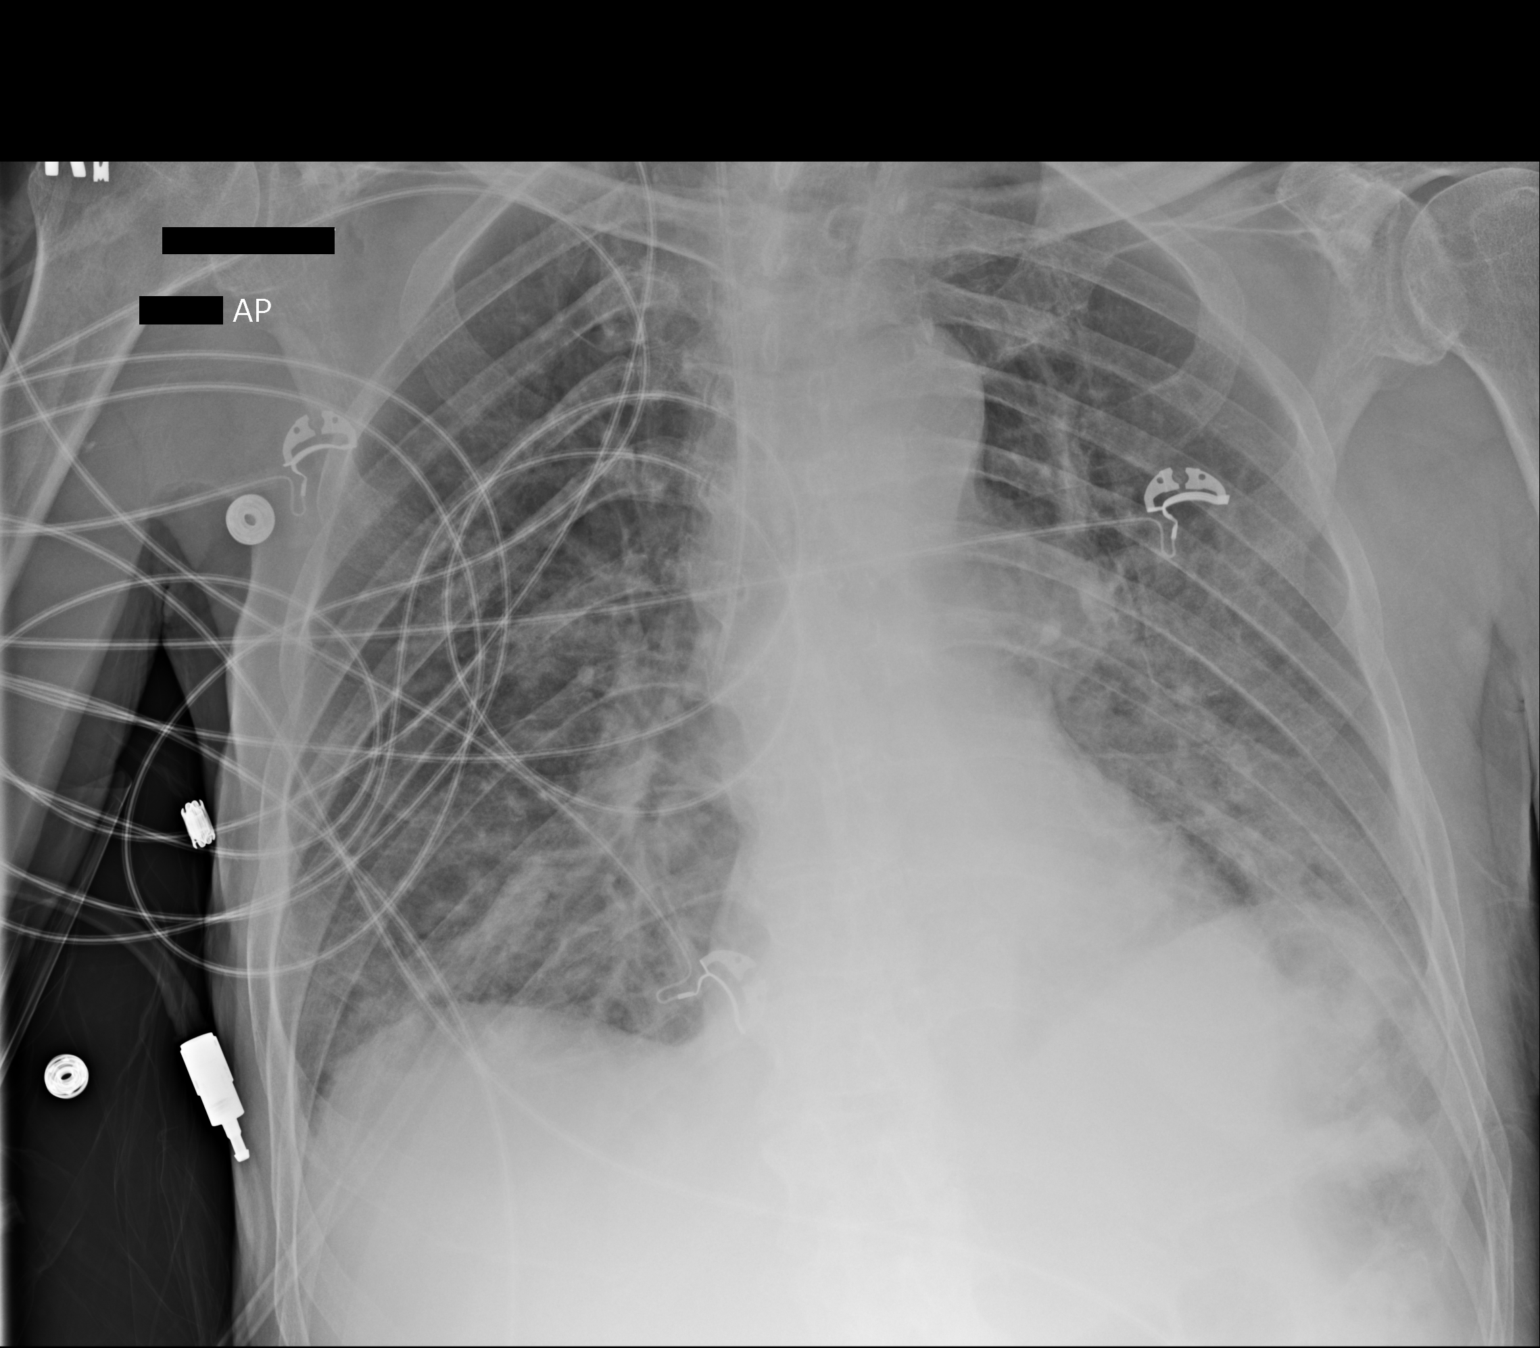

[1 of 1 positions shown; findings below may reference images not displayed]

FINDINGS: Right IJ catheter in unchanged position. Normal heart size.
Mediastinal contours are distorted by leftward rotation.

There are faint Kerley B-lines. Indistinct, but somewhat streaky
bibasilar opacities. No significant effusion. No pneumothorax.
IMPRESSION: 1. Bronchitic changes which could be congestive or infectious.
2. Bibasilar atelectasis or pneumonia.

## 2017-04-13 DEATH — deceased
